# Patient Record
Sex: Male | Born: 1990 | Race: Black or African American | Hispanic: No | Marital: Single | Smoking: Current every day smoker
Health system: Southern US, Community
[De-identification: ages and names within clinical notes are randomized; demographics above are authoritative.]

## PROBLEM LIST (undated history)

## (undated) ENCOUNTER — Emergency Department: Admission: EM | Payer: Medicaid Other | Source: Home / Self Care

## (undated) DIAGNOSIS — F32A Depression, unspecified: Secondary | ICD-10-CM

## (undated) DIAGNOSIS — F319 Bipolar disorder, unspecified: Secondary | ICD-10-CM

## (undated) DIAGNOSIS — F1910 Other psychoactive substance abuse, uncomplicated: Secondary | ICD-10-CM

## (undated) DIAGNOSIS — F259 Schizoaffective disorder, unspecified: Secondary | ICD-10-CM

## (undated) DIAGNOSIS — F329 Major depressive disorder, single episode, unspecified: Secondary | ICD-10-CM

## (undated) HISTORY — PX: DENTAL SURGERY: SHX609

---

## 2012-06-25 ENCOUNTER — Encounter (HOSPITAL_COMMUNITY): Payer: Self-pay

## 2012-06-25 ENCOUNTER — Emergency Department (HOSPITAL_COMMUNITY)
Admission: EM | Admit: 2012-06-25 | Discharge: 2012-06-25 | Disposition: A | Discharge status: Home / Self Care | Payer: Self-pay | Attending: Emergency Medicine | Admitting: Emergency Medicine

## 2012-06-25 DIAGNOSIS — F319 Bipolar disorder, unspecified: Secondary | ICD-10-CM | POA: Insufficient documentation

## 2012-06-25 DIAGNOSIS — J069 Acute upper respiratory infection, unspecified: Secondary | ICD-10-CM | POA: Insufficient documentation

## 2012-06-25 DIAGNOSIS — Z79899 Other long term (current) drug therapy: Secondary | ICD-10-CM | POA: Insufficient documentation

## 2012-06-25 DIAGNOSIS — Z87891 Personal history of nicotine dependence: Secondary | ICD-10-CM | POA: Insufficient documentation

## 2012-06-25 DIAGNOSIS — F3289 Other specified depressive episodes: Secondary | ICD-10-CM | POA: Insufficient documentation

## 2012-06-25 DIAGNOSIS — F209 Schizophrenia, unspecified: Secondary | ICD-10-CM | POA: Insufficient documentation

## 2012-06-25 DIAGNOSIS — F329 Major depressive disorder, single episode, unspecified: Secondary | ICD-10-CM | POA: Insufficient documentation

## 2012-06-25 HISTORY — DX: Schizoaffective disorder, unspecified: F25.9

## 2012-06-25 HISTORY — DX: Bipolar disorder, unspecified: F31.9

## 2012-06-25 HISTORY — DX: Other psychoactive substance abuse, uncomplicated: F19.10

## 2012-06-25 HISTORY — DX: Depression, unspecified: F32.A

## 2012-06-25 HISTORY — DX: Major depressive disorder, single episode, unspecified: F32.9

## 2012-06-25 MED ORDER — ALBUTEROL SULFATE HFA 108 (90 BASE) MCG/ACT IN AERS
2.0000 | INHALATION_SPRAY | Freq: Once | RESPIRATORY_TRACT | Status: AC
  Administered 2012-06-25: 2 via RESPIRATORY_TRACT
  Filled 2012-06-25: qty 6.7

## 2012-06-25 MED ORDER — GUAIFENESIN ER 600 MG PO TB12: 1200.0000 mg | ORAL_TABLET | Freq: Two times a day (BID) | ORAL | Status: DC

## 2012-06-25 MED ORDER — DEXTROMETHORPHAN HBR 15 MG/5ML PO SYRP: 10.0000 mL | ORAL_SOLUTION | Freq: Four times a day (QID) | ORAL | Status: DC | PRN

## 2012-06-25 NOTE — ED Provider Notes (Signed)
Medical screening examination/treatment/procedure(s) were performed by non-physician practitioner and as supervising physician I was immediately available for consultation/collaboration.   Glynn Octave, MD 06/25/12 [phone]

## 2012-06-25 NOTE — ED Notes (Signed)
Pt presents with sudden onset of productive cough that has green phlegm and generalized body aches. Unsure of fever;  Other family members with same.

## 2012-06-25 NOTE — ED Provider Notes (Signed)
History     CSN: [id number]  Arrival date & time 06/25/12  1124   First MD Initiated Contact with Patient 06/25/12 1208      Chief Complaint  Patient presents with  . Cough    (Consider location/radiation/quality/duration/timing/severity/associated sxs/prior treatment) HPI 22 year old male with past medical history of substance abuse who is currently undergoing treatment at the [facility] health presents today with chief complaint of influenza-like illness.  Onset was yesterday.  He denies any fevers.  He has a productive cough with mild sore throat.  Pain in throat is worse with cough.  He also has pain in chest with cough.  He denies any otalgia.  Had rhinorrhea, malaise, myalgia.  Patient has a history of asthma and has had some mild wheezing at night. No other complaints at this time. Past Medical History  Diagnosis Date  . Bipolar 1 disorder   . Schizoaffective disorder   . Depression   . Drug abuse     Past Surgical History  Procedure Date  . Dental surgery     History reviewed. No pertinent family history.  History  Substance Use Topics  . Smoking status: Former Games developer  . Smokeless tobacco: Not on file  . Alcohol Use: No      Review of Systems Ten systems reviewed and are negative for acute change, except as noted in the HPI.   Allergies  Review of patient's allergies indicates no known allergies.  Home Medications   Current Outpatient Rx  Name  Route  Sig  Dispense  Refill  . DIVALPROEX SODIUM 250 MG PO TBEC   Oral   Take 250 mg by mouth 3 (three) times daily.         Marland Kitchen MIRTAZAPINE 15 MG PO TABS   Oral   Take 15 mg by mouth at bedtime.         Marland Kitchen NAPROXEN 500 MG PO TABS   Oral   Take 500 mg by mouth 2 (two) times daily with a meal.         . OLANZAPINE 10 MG PO TABS   Oral   Take 10 mg by mouth at bedtime.         Marland Kitchen DEXTROMETHORPHAN HBR 15 MG/5ML PO SYRP   Oral   Take 10 mLs (30 mg total) by mouth 4 (four) times daily as needed for  cough.   120 mL   0   . GUAIFENESIN ER 600 MG PO TB12   Oral   Take 2 tablets (1,200 mg total) by mouth 2 (two) times daily.   20 tablet   0     BP 134/70  Pulse 92  Temp 98.5 F (36.9 C) (Oral)  Resp 18  SpO2 98%  Physical Exam Appears moderately ill but not toxic; temperature as noted in vitals. Ears normal.  TMs normal Eyes:glassy appearance, no discharge  Heart: RRR, NO M/G/R Throat and pharynx mild erythema.  Cobblestoning on the posterior pharynx consistent with postnasal drip.   Neck supple. No adenopathyhy in the neck.  Sinuses non tender.  The chest is clear. Abdomen is soft and nontende  ED Course  Procedures (including critical care time)  Labs Reviewed - No data to display No results found.   1. URI (upper respiratory infection)   2. Influenza-like illness       MDM  Patient with symptoms consistent with influenza.  Vitals are stable, low-grade fever.  No signs of dehydration, tolerating PO's.  Lungs are clear. Due  to patient's presentation and physical exam a chest x-ray was not ordered bc likely diagnosis of flu.  Discussed the cost versus benefit of Tamiflu treatment with the patient.  The patient understands that symptoms are greater than the recommended 24-48 hour window of treatment.  Patient will be discharged with instructions to orally hydrate, rest, and use over-the-counter medications such as anti-inflammatories ibuprofen and Aleve for muscle aches and Tylenol for fever.  Patient will also be given a cough suppressant.         Arthor Captain, PA-C 06/25/12 1418

## 2012-07-10 ENCOUNTER — Emergency Department (HOSPITAL_COMMUNITY)
Admission: EM | Admit: 2012-07-10 | Discharge: 2012-07-10 | Disposition: A | Discharge status: Home / Self Care | Payer: Self-pay | Attending: Emergency Medicine | Admitting: Emergency Medicine

## 2012-07-10 ENCOUNTER — Encounter (HOSPITAL_COMMUNITY): Payer: Self-pay | Admitting: Emergency Medicine

## 2012-07-10 ENCOUNTER — Emergency Department (HOSPITAL_COMMUNITY): Payer: Self-pay

## 2012-07-10 DIAGNOSIS — S60229A Contusion of unspecified hand, initial encounter: Secondary | ICD-10-CM | POA: Insufficient documentation

## 2012-07-10 DIAGNOSIS — Z79899 Other long term (current) drug therapy: Secondary | ICD-10-CM | POA: Insufficient documentation

## 2012-07-10 DIAGNOSIS — Y929 Unspecified place or not applicable: Secondary | ICD-10-CM | POA: Insufficient documentation

## 2012-07-10 DIAGNOSIS — Z87891 Personal history of nicotine dependence: Secondary | ICD-10-CM | POA: Insufficient documentation

## 2012-07-10 DIAGNOSIS — F209 Schizophrenia, unspecified: Secondary | ICD-10-CM | POA: Insufficient documentation

## 2012-07-10 DIAGNOSIS — F259 Schizoaffective disorder, unspecified: Secondary | ICD-10-CM | POA: Insufficient documentation

## 2012-07-10 DIAGNOSIS — F319 Bipolar disorder, unspecified: Secondary | ICD-10-CM | POA: Insufficient documentation

## 2012-07-10 DIAGNOSIS — F329 Major depressive disorder, single episode, unspecified: Secondary | ICD-10-CM | POA: Insufficient documentation

## 2012-07-10 DIAGNOSIS — Z791 Long term (current) use of non-steroidal anti-inflammatories (NSAID): Secondary | ICD-10-CM | POA: Insufficient documentation

## 2012-07-10 DIAGNOSIS — F3289 Other specified depressive episodes: Secondary | ICD-10-CM | POA: Insufficient documentation

## 2012-07-10 DIAGNOSIS — W2209XA Striking against other stationary object, initial encounter: Secondary | ICD-10-CM | POA: Insufficient documentation

## 2012-07-10 DIAGNOSIS — Y9389 Activity, other specified: Secondary | ICD-10-CM | POA: Insufficient documentation

## 2012-07-10 MED ORDER — IBUPROFEN 800 MG PO TABS: 800.0000 mg | ORAL_TABLET | Freq: Three times a day (TID) | ORAL | Status: DC

## 2012-07-10 MED ORDER — TRAMADOL HCL 50 MG PO TABS: 50.0000 mg | ORAL_TABLET | Freq: Four times a day (QID) | ORAL | Status: DC | PRN

## 2012-07-10 MED ORDER — IBUPROFEN 400 MG PO TABS
800.0000 mg | ORAL_TABLET | Freq: Once | ORAL | Status: AC
  Administered 2012-07-10: 800 mg via ORAL
  Filled 2012-07-10: qty 2

## 2012-07-10 MED ORDER — TRAMADOL HCL 50 MG PO TABS
50.0000 mg | ORAL_TABLET | Freq: Once | ORAL | Status: AC
  Administered 2012-07-10: 50 mg via ORAL
  Filled 2012-07-10: qty 1

## 2012-07-10 NOTE — ED Notes (Signed)
Pt. Stated, i was at the car wash and I slammed my rt. Hand in the car door.

## 2012-07-10 NOTE — ED Provider Notes (Signed)
History    This chart was scribed for Ryan Emery PA-C a non-physician practitioner working with Suzi Roots, MD by Lewanda Rife, ED Scribe. This patient was seen in room TR06C/TR06C and the patient's care was started at 3:50pm.    CSN: [id number]  Arrival date & time 07/10/12  1424   First MD Initiated Contact with Patient 07/10/12 1438      Chief Complaint  Patient presents with  . Hand Injury    (Consider location/radiation/quality/duration/timing/severity/associated sxs/prior treatment) HPI Ryan Mcknight is a 22 y.o. male who presents to the Emergency Department complaining of constant moderate right hand pain after slamming it in a car door at the car wash onset today. Pt reports pain is improving at this time. Pt denies any other injuries. Pt reports pain increases with movement and improved at rest. Pt denies taking any pain medication or applying ice to treat symptoms.  Past Medical History  Diagnosis Date  . Bipolar 1 disorder   . Schizoaffective disorder   . Depression   . Drug abuse     Past Surgical History  Procedure Date  . Dental surgery     No family history on file.  History  Substance Use Topics  . Smoking status: Former Games developer  . Smokeless tobacco: Not on file  . Alcohol Use: No      Review of Systems  Constitutional: Negative.   HENT: Negative.   Respiratory: Negative.   Cardiovascular: Negative.   Gastrointestinal: Negative.   Musculoskeletal: Positive for myalgias (hand injury).  Skin: Negative.   Neurological: Negative.   Hematological: Negative.   Psychiatric/Behavioral: Negative.   All other systems reviewed and are negative.   A complete 10 system review of systems was obtained and all systems are negative except as noted in the HPI and PMH.   Allergies  Review of patient's allergies indicates no known allergies.  Home Medications   Current Outpatient Rx  Name  Route  Sig  Dispense  Refill  .  DEXTROMETHORPHAN HBR 15 MG/5ML PO SYRP   Oral   Take 10 mLs (30 mg total) by mouth 4 (four) times daily as needed for cough.   120 mL   0   . DIVALPROEX SODIUM 250 MG PO TBEC   Oral   Take 250 mg by mouth 3 (three) times daily.         . GUAIFENESIN ER 600 MG PO TB12   Oral   Take 2 tablets (1,200 mg total) by mouth 2 (two) times daily.   20 tablet   0   . MIRTAZAPINE 15 MG PO TABS   Oral   Take 15 mg by mouth at bedtime.         Marland Kitchen NAPROXEN 500 MG PO TABS   Oral   Take 500 mg by mouth 2 (two) times daily with a meal.         . OLANZAPINE 10 MG PO TABS   Oral   Take 10 mg by mouth at bedtime.           BP 134/77  Pulse 98  Temp 97.9 F (36.6 C) (Oral)  Resp 16  SpO2 98%  Physical Exam  Nursing note and vitals reviewed. Constitutional: He is oriented to person, place, and time. He appears well-developed and well-nourished. No distress.  HENT:  Head: Normocephalic.  Eyes: Conjunctivae normal and EOM are normal.  Neck: Normal range of motion. Neck supple.  Cardiovascular: Normal rate.   Pulmonary/Chest: Effort normal.  No stridor.  Musculoskeletal: Normal range of motion. He exhibits tenderness.       Mild swelling to dorsum of right hand.  Nonfocal tenderness to palpation to all 4 MCPs of right hand. Full active ROM of all 5 digits of right hand with flexion and extension.  Neurological: He is alert and oriented to person, place, and time.  Skin: Skin is warm.  Psychiatric: He has a normal mood and affect. His behavior is normal.    ED Course  Procedures (including critical care time)  Labs Reviewed - No data to display Dg Hand Complete Right  07/10/2012  *RADIOLOGY REPORT*  Clinical Data: Slammed right hand in a car door now with pain radiating across all MCP joints.  RIGHT HAND - COMPLETE 3+ VIEW  Comparison: None.  Findings:  There is minimal soft tissue swelling about the MCP joints of the hand without associated fracture or dislocation.  No  radiopaque foreign body.  Joint spaces are preserved.  No erosions.  IMPRESSION: Minimal soft tissue swelling about the MCP joints of the hand without associated fracture, dislocation or radiopaque foreign body.   Original Report Authenticated By: Tacey Ruiz, MD      1. Hand contusion       MDM     Filed Vitals:   07/10/12 1429  BP: 134/77  Pulse: 98  Temp: 97.9 F (36.6 C)  TempSrc: Oral  Resp: 16  SpO2: 98%     Pt verbalized understanding and agrees with care plan. Outpatient follow-up and return precautions given.    New Prescriptions   IBUPROFEN (ADVIL,MOTRIN) 800 MG TABLET    Take 1 tablet (800 mg total) by mouth 3 (three) times daily.   TRAMADOL (ULTRAM) 50 MG TABLET    Take 1 tablet (50 mg total) by mouth every 6 (six) hours as needed for pain.    I personally performed the services described in this documentation, which was scribed in my presence. The recorded information has been reviewed and is accurate.   Ryan Emery, PA-C 07/12/12 1224

## 2012-07-13 NOTE — ED Provider Notes (Signed)
Medical screening examination/treatment/procedure(s) were performed by non-physician practitioner and as supervising physician I was immediately available for consultation/collaboration.   Suzi Roots, MD 07/13/12 [phone]

## 2018-12-11 ENCOUNTER — Emergency Department
Admission: EM | Admit: 2018-12-11 | Discharge: 2018-12-11 | Disposition: A | Discharge status: Home / Self Care | Payer: Self-pay | Attending: Emergency Medicine | Admitting: Emergency Medicine

## 2018-12-11 ENCOUNTER — Emergency Department: Payer: Self-pay

## 2018-12-11 ENCOUNTER — Encounter: Payer: Self-pay | Admitting: Emergency Medicine

## 2018-12-11 ENCOUNTER — Other Ambulatory Visit: Payer: Self-pay

## 2018-12-11 DIAGNOSIS — R0789 Other chest pain: Secondary | ICD-10-CM | POA: Insufficient documentation

## 2018-12-11 DIAGNOSIS — Z79899 Other long term (current) drug therapy: Secondary | ICD-10-CM | POA: Insufficient documentation

## 2018-12-11 DIAGNOSIS — K219 Gastro-esophageal reflux disease without esophagitis: Secondary | ICD-10-CM | POA: Insufficient documentation

## 2018-12-11 DIAGNOSIS — F17210 Nicotine dependence, cigarettes, uncomplicated: Secondary | ICD-10-CM | POA: Insufficient documentation

## 2018-12-11 LAB — COMPREHENSIVE METABOLIC PANEL
ALT: 18 U/L (ref 0–44)
AST: 30 U/L (ref 15–41)
Albumin: 4 g/dL (ref 3.5–5.0)
Alkaline Phosphatase: 75 U/L (ref 38–126)
Anion gap: 11 (ref 5–15)
BUN: 13 mg/dL (ref 6–20)
CO2: 20 mmol/L — ABNORMAL LOW (ref 22–32)
Calcium: 9.4 mg/dL (ref 8.9–10.3)
Chloride: 106 mmol/L (ref 98–111)
Creatinine, Ser: 0.82 mg/dL (ref 0.61–1.24)
GFR calc Af Amer: >60 mL/min (ref >60)
GFR calc non Af Amer: >60 mL/min (ref >60)
Glucose, Bld: 91 mg/dL (ref 70–99)
Potassium: 4.6 mmol/L (ref 3.5–5.1)
Sodium: 137 mmol/L (ref 135–145)
Total Bilirubin: 0.9 mg/dL (ref 0.3–1.2)
Total Protein: 7.5 g/dL (ref 6.5–8.1)

## 2018-12-11 LAB — URINE DRUG SCREEN, QUALITATIVE (ARMC ONLY)
Amphetamines, Ur Screen: NOT DETECTED
Barbiturates, Ur Screen: NOT DETECTED
Benzodiazepine, Ur Scrn: NOT DETECTED
Cannabinoid 50 Ng, Ur ~~LOC~~: NOT DETECTED
Cocaine Metabolite,Ur ~~LOC~~: NOT DETECTED
MDMA (Ecstasy)Ur Screen: NOT DETECTED
Methadone Scn, Ur: NOT DETECTED
Opiate, Ur Screen: NOT DETECTED
Phencyclidine (PCP) Ur S: NOT DETECTED
Tricyclic, Ur Screen: NOT DETECTED

## 2018-12-11 LAB — CBC WITH DIFFERENTIAL/PLATELET
Abs Immature Granulocytes: 0.02 10*3/uL (ref 0.00–0.07)
Basophils Absolute: 0.1 10*3/uL (ref 0.0–0.1)
Basophils Relative: 1 %
Eosinophils Absolute: 0.1 10*3/uL (ref 0.0–0.5)
Eosinophils Relative: 1 %
HCT: 48.8 % (ref 39.0–52.0)
Hemoglobin: 16.4 g/dL (ref 13.0–17.0)
Immature Granulocytes: 0 %
Lymphocytes Relative: 35 %
Lymphs Abs: 3.8 10*3/uL (ref 0.7–4.0)
MCH: 30.4 pg (ref 26.0–34.0)
MCHC: 33.6 g/dL (ref 30.0–36.0)
MCV: 90.5 fL (ref 80.0–100.0)
Monocytes Absolute: 0.6 10*3/uL (ref 0.1–1.0)
Monocytes Relative: 6 %
Neutro Abs: 6.1 10*3/uL (ref 1.7–7.7)
Neutrophils Relative %: 57 %
Platelets: 232 10*3/uL (ref 150–400)
RBC: 5.39 MIL/uL (ref 4.22–5.81)
RDW: 12.5 % (ref 11.5–15.5)
WBC: 10.7 10*3/uL — ABNORMAL HIGH (ref 4.0–10.5)
nRBC: 0 % (ref 0.0–0.2)

## 2018-12-11 LAB — LIPASE, BLOOD: Lipase: 24 U/L (ref 11–51)

## 2018-12-11 LAB — TROPONIN I (HIGH SENSITIVITY): Troponin I (High Sensitivity): 6 ng/L (ref <18)

## 2018-12-11 IMAGING — CR DG ABDOMEN ACUTE W/ 1V CHEST
7 of 10 series · 7 of 10 positions shown · non-contrast
Comparison: None.

CLINICAL DATA: Chest pain. Left upper quadrant pain.

EXAM:
DG ABDOMEN ACUTE W/ 1V CHEST

[chest pa (1 of 4)]
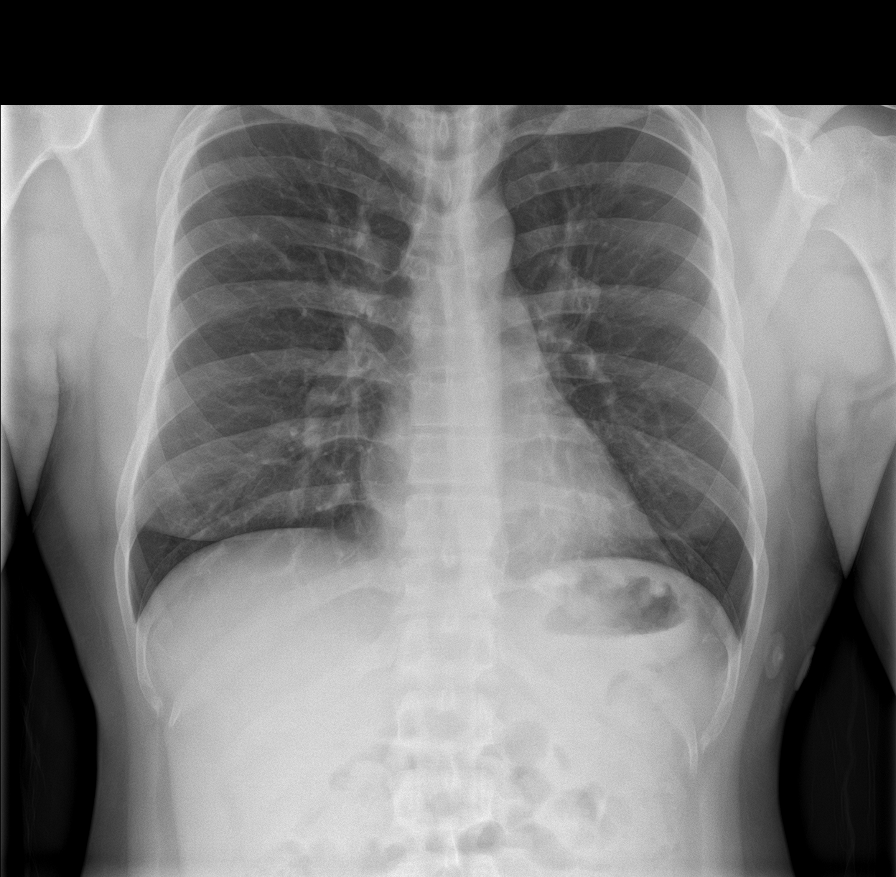

[chest pa (2 of 4)]
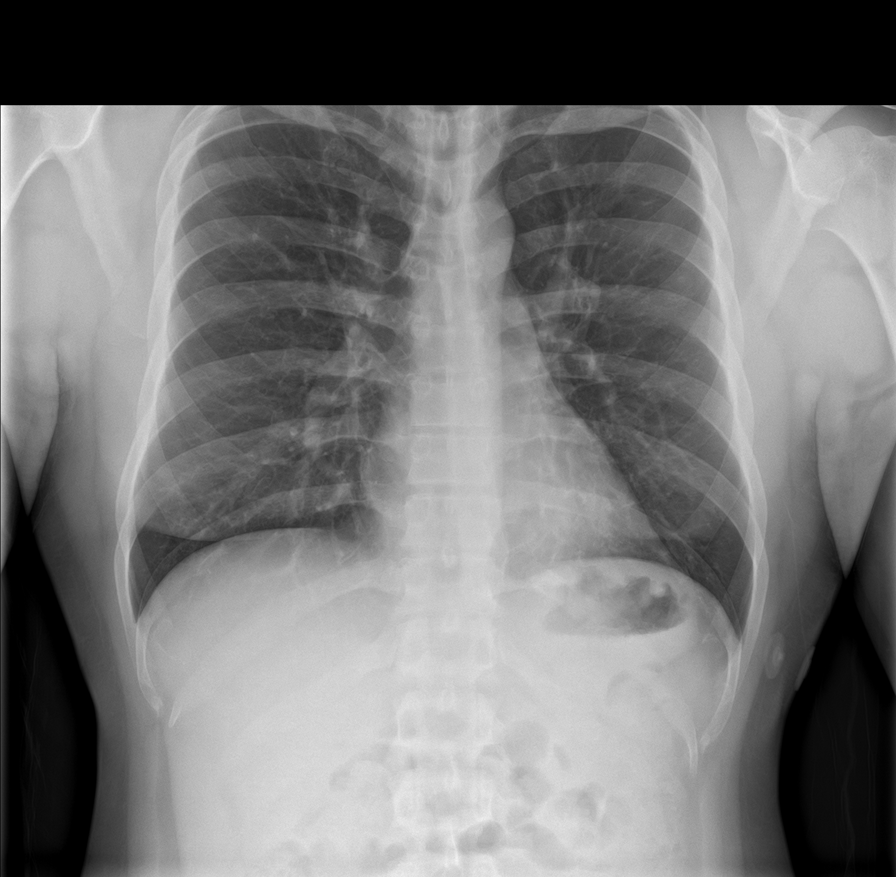

[chest pa (3 of 4)]
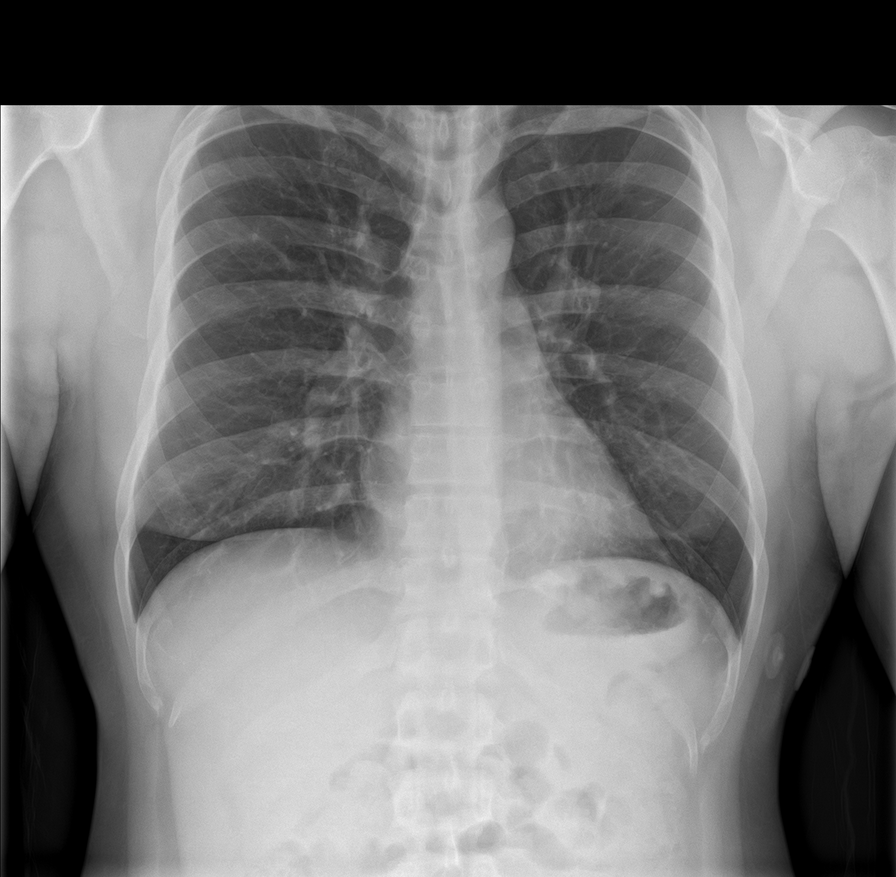

[chest pa (4 of 4)]
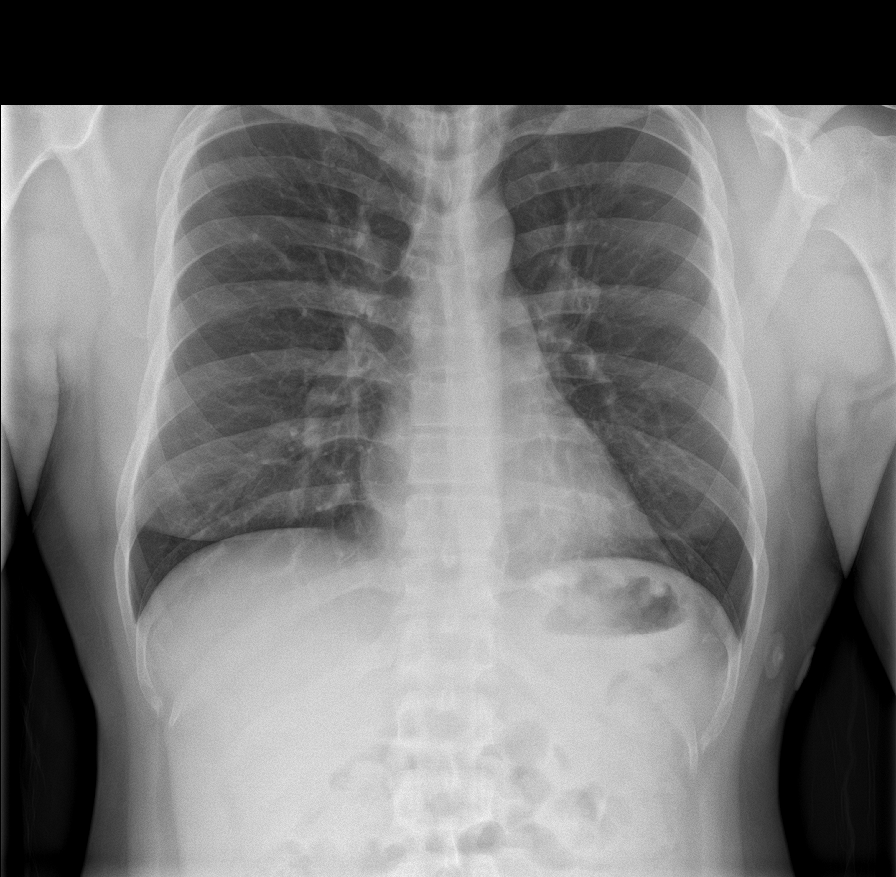

[abdomen erect (1 of 3)]
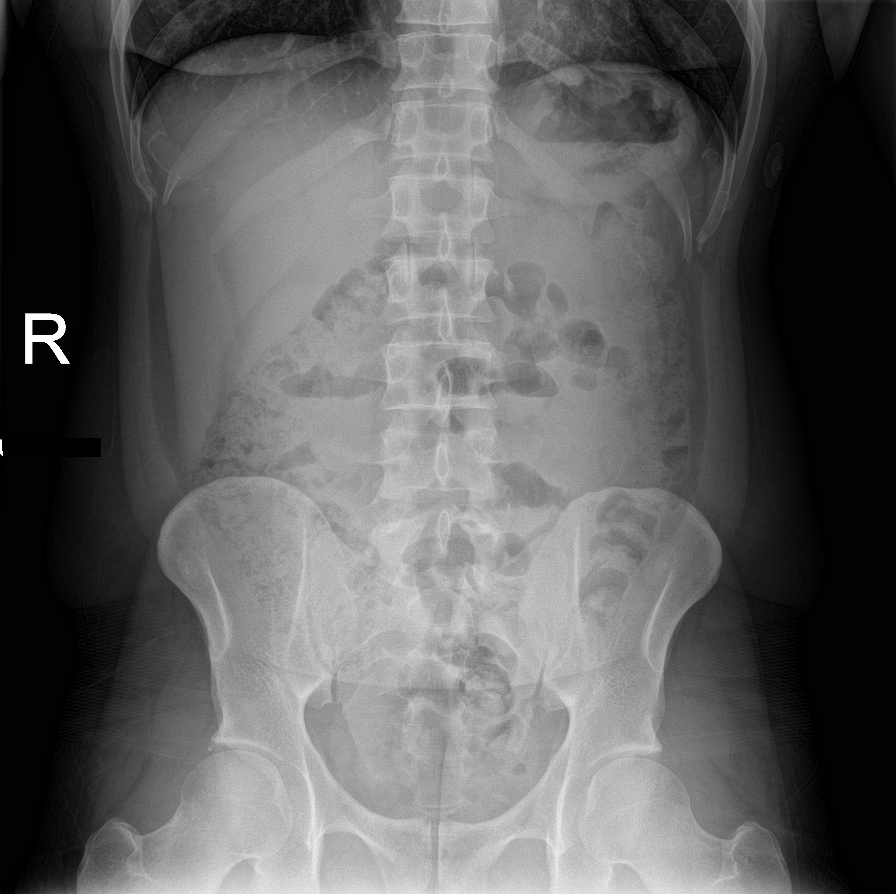

[abdomen erect (2 of 3)]
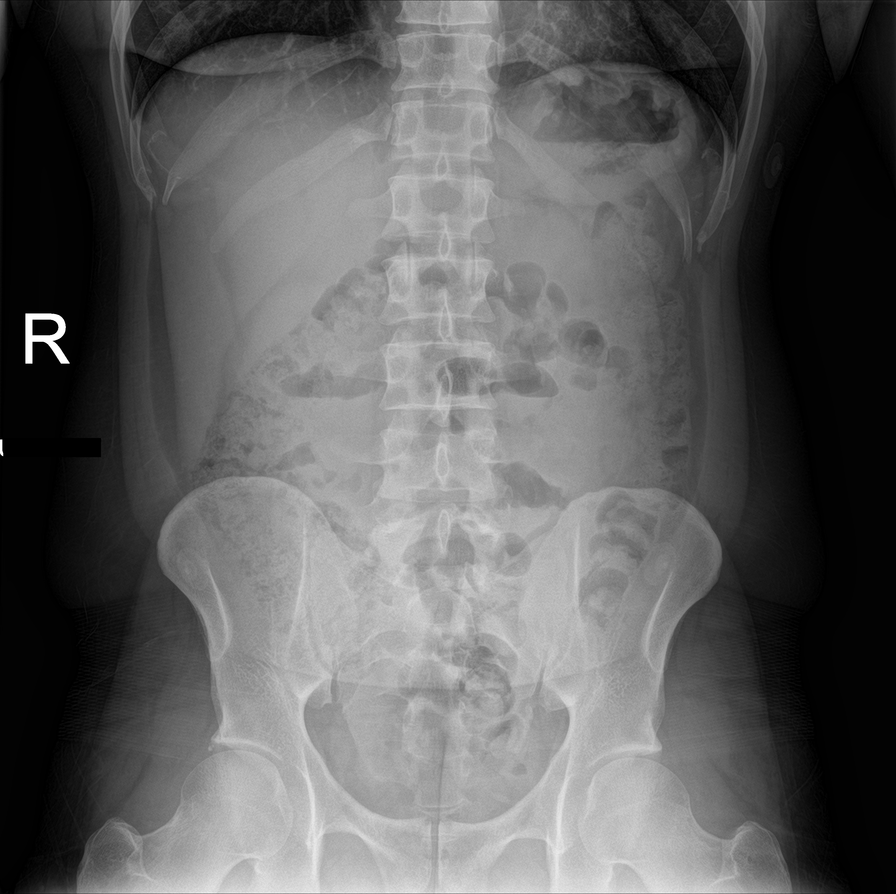

[abdomen erect (3 of 3)]
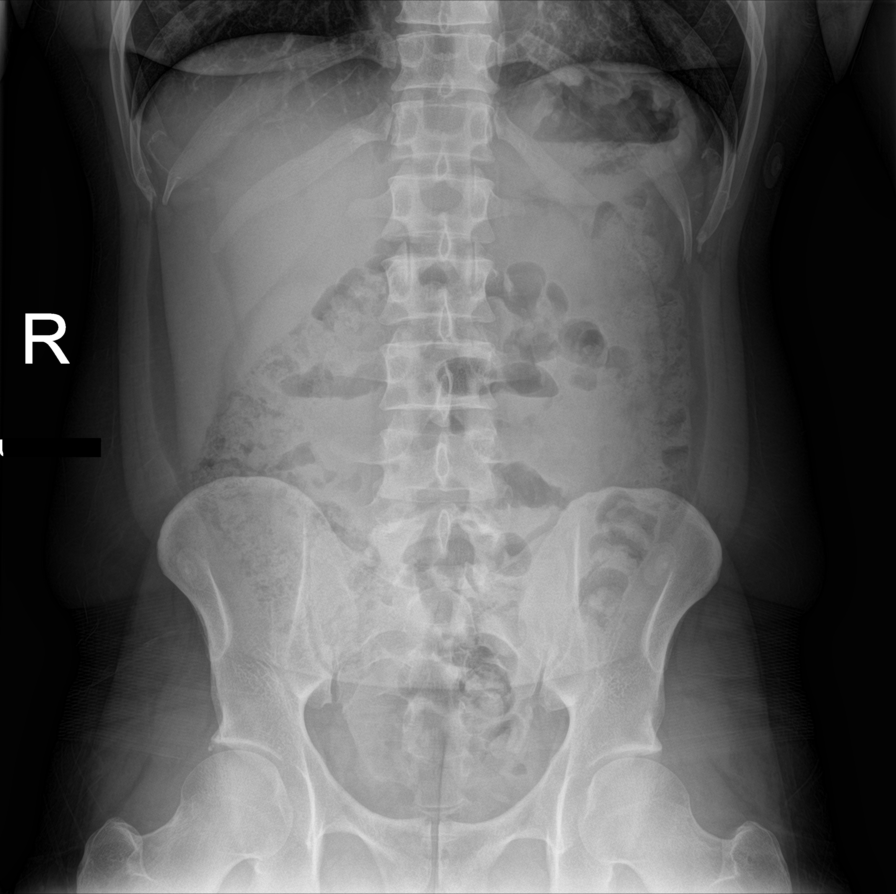

[7 of 10 positions shown; findings below may reference images not displayed]

FINDINGS: The cardiomediastinal contours are normal. The lungs are clear. EKG
leads overlie the chest. There is no free intra-abdominal air. No
dilated bowel loops to suggest obstruction. Moderate volume of stool
in the ascending and descending colon. No radiopaque calculi. No
acute osseous abnormalities are seen.
IMPRESSION: 1. Normal bowel gas pattern. Moderate volume of colonic stool.
2. Clear lungs.

## 2018-12-11 MED ORDER — LIDOCAINE VISCOUS HCL 2 % MT SOLN
15.0000 mL | Freq: Once | OROMUCOSAL | Status: AC
  Administered 2018-12-11: 15 mL via ORAL
  Filled 2018-12-11: qty 15

## 2018-12-11 MED ORDER — FAMOTIDINE IN NACL 20-0.9 MG/50ML-% IV SOLN
20.0000 mg | Freq: Once | INTRAVENOUS | Status: AC
  Administered 2018-12-11: 20 mg via INTRAVENOUS
  Filled 2018-12-11: qty 50

## 2018-12-11 MED ORDER — ALUM & MAG HYDROXIDE-SIMETH 200-200-20 MG/5ML PO SUSP
30.0000 mL | Freq: Once | ORAL | Status: AC
  Administered 2018-12-11: 30 mL via ORAL
  Filled 2018-12-11: qty 30

## 2018-12-11 MED ORDER — FAMOTIDINE 40 MG PO TABS: 40.0000 mg | ORAL_TABLET | Freq: Every day | ORAL | 0 refills | Status: AC

## 2018-12-11 NOTE — ED Triage Notes (Signed)
Patient with complaint of chest pain that started 2-3 days ago. Patient states that the pain woke him from his sleep tonight. Denies shortness of breath, nausea or vomiting.

## 2018-12-11 NOTE — ED Notes (Signed)
Pt to xray at this time.

## 2018-12-11 NOTE — ED Provider Notes (Signed)
[facility] Emergency Department Provider Note  ____________________________________________  Time seen: Approximately 2:30 AM  I have reviewed the triage vital signs and the nursing notes.   HISTORY  Chief Complaint Chest Pain   HPI Ryan Mcknight is a 28 y.o. male history of schizoaffective disorder, smoking, former drug abuse now clean for 2 years who presents for evaluation of chest pain.  Patient reports 3 to 4 days of constant pressure located on the left side of his chest which is mild to moderate in intensity.  He reports that if he stretches or if he pulls his rib cage away from his body that the pain relieves.  He denies shortness of breath or cough, fever chills, abdominal pain, nausea, vomiting, diarrhea.  No personal family history of heart attacks, PE or DVT, no recent travel immobilization, no leg pain or swelling, no hemoptysis, no exogenous hormones.  Patient reports intermittent episodes of burning in his chest as well.  He has been taking over-the-counter NSAIDs and Tums with minimal relief.  This evening he reports a sharp pain on top of the constant pressure which woke him up from his sleep.  That sharp pain lasted just a few seconds and has resolved.  Past Medical History:  Diagnosis Date  . Bipolar 1 disorder ([place])   . Depression   . Drug abuse ([place])   . Schizoaffective disorder ([place])     There are no active problems to display for this patient.   Past Surgical History:  Procedure Laterality Date  . DENTAL SURGERY      Prior to Admission medications   Medication Sig Start Date End Date Taking? Authorizing Provider  dextromethorphan (COUGH SUPPRESSANT) 15 MG/5ML syrup Take 10 mLs (30 mg total) by mouth 4 (four) times daily as needed for cough. 06/25/12   Margarita Mail, PA-C  divalproex (DEPAKOTE) 250 MG DR tablet Take 250 mg by mouth 3 (three) times daily.    [provider]  famotidine (PEPCID) 40 MG tablet Take 1  tablet (40 mg total) by mouth at bedtime. 12/11/18 01/10/19  Rudene Re, MD  guaiFENesin (MUCINEX) 600 MG 12 hr tablet Take 2 tablets (1,200 mg total) by mouth 2 (two) times daily. 06/25/12   Margarita Mail, PA-C  ibuprofen (ADVIL,MOTRIN) 800 MG tablet Take 1 tablet (800 mg total) by mouth 3 (three) times daily. 07/10/12   Pisciotta, Elmyra Ricks, PA-C  mirtazapine (REMERON) 15 MG tablet Take 15 mg by mouth at bedtime.    [provider]  naproxen (NAPROSYN) 500 MG tablet Take 500 mg by mouth 2 (two) times daily with a meal.    [provider]  OLANZapine (ZYPREXA) 10 MG tablet Take 10 mg by mouth at bedtime.    [provider]  traMADol (ULTRAM) 50 MG tablet Take 1 tablet (50 mg total) by mouth every 6 (six) hours as needed for pain. 07/10/12   Pisciotta, Elmyra Ricks, PA-C    Allergies Patient has no known allergies.  No family history on file.  Social History Social History   Tobacco Use  . Smoking status: Current Every Day Smoker  . Smokeless tobacco: Never Used  Substance Use Topics  . Alcohol use: No  . Drug use: Not Currently    Review of Systems  Constitutional: Negative for fever. Eyes: Negative for visual changes. ENT: Negative for sore throat. Neck: No neck pain  Cardiovascular: + chest pain. Respiratory: Negative for shortness of breath. Gastrointestinal: Negative for abdominal pain, vomiting or diarrhea. Genitourinary: Negative  for dysuria. Musculoskeletal: Negative for back pain. Skin: Negative for rash. Neurological: Negative for headaches, weakness or numbness. Psych: No SI or HI  ____________________________________________   PHYSICAL EXAM:  VITAL SIGNS: ED Triage Vitals  Enc Vitals Group     BP 12/11/18 0215 126/90     Pulse Rate 12/11/18 0215 (!) 58     Resp 12/11/18 0215 18     Temp 12/11/18 0215 98 F (36.7 C)     Temp Source 12/11/18 0215 Oral     SpO2 12/11/18 0215 95 %     Weight 12/11/18 0212 195 lb (88.5 kg)     Height  12/11/18 0212 5\' 9"  (1.753 m)     Head Circumference --      Peak Flow --      Pain Score 12/11/18 0212 9     Pain Loc --      Pain Edu? --      Excl. in GC? --     Constitutional: Alert and oriented. Well appearing and in no apparent distress. HEENT:      Head: Normocephalic and atraumatic.         Eyes: Conjunctivae are normal. Sclera is non-icteric.       Mouth/Throat: Mucous membranes are moist.       Neck: Supple with no signs of meningismus. Cardiovascular: Regular rate and rhythm. No murmurs, gallops, or rubs. 2+ symmetrical distal pulses are present in all extremities. No JVD.  Palpation of the chest wall on the left reproduces the pain Respiratory: Normal respiratory effort. Lungs are clear to auscultation bilaterally. No wheezes, crackles, or rhonchi.  Gastrointestinal: Soft, tender in LUQ, and non distended with positive bowel sounds. No rebound or guarding. Musculoskeletal: Nontender with normal range of motion in all extremities. No edema, cyanosis, or erythema of extremities. Neurologic: Normal speech and language. Face is symmetric. Moving all extremities. No gross focal neurologic deficits are appreciated. Skin: Skin is warm, dry and intact. No rash noted. Psychiatric: Mood and affect are normal. Speech and behavior are normal.  ____________________________________________   LABS (all labs ordered are listed, but only abnormal results are displayed)  Labs Reviewed  CBC WITH DIFFERENTIAL/PLATELET - Abnormal; Notable for the following components:      Result Value   WBC 10.7 (*)    All other components within normal limits  COMPREHENSIVE METABOLIC PANEL - Abnormal; Notable for the following components:   CO2 20 (*)    All other components within normal limits  TROPONIN I (HIGH SENSITIVITY)  URINE DRUG SCREEN, QUALITATIVE ([facility] ONLY)  LIPASE, BLOOD   ____________________________________________  EKG  ED ECG REPORT I, Nita Sicklearolina Anuj Summons, the attending  physician, personally viewed and interpreted this ECG.  Sinus bradycardia, rate of 59, normal intervals, right axis deviation, no ST elevations or depressions.  No prior for comparison. ____________________________________________  RADIOLOGY  I have personally reviewed the images performed during this visit and I agree with the Radiologist's read.   Interpretation by Radiologist:  Dg Abdomen Acute W/chest  Result Date: 12/11/2018 CLINICAL DATA:  Chest pain. Left upper quadrant pain. EXAM: DG ABDOMEN ACUTE W/ 1V CHEST COMPARISON:  None. FINDINGS: The cardiomediastinal contours are normal. The lungs are clear. EKG leads overlie the chest. There is no free intra-abdominal air. No dilated bowel loops to suggest obstruction. Moderate volume of stool in the ascending and descending colon. No radiopaque calculi. No acute osseous abnormalities are seen. IMPRESSION: 1. Normal bowel gas pattern. Moderate volume of colonic stool. 2. Clear lungs.  Electronically Signed   By: Narda RutherfordMelanie  Sanford M.D.   On: 12/11/2018 03:30      ____________________________________________   PROCEDURES  Procedure(s) performed: None Procedures Critical Care performed:  None ____________________________________________   INITIAL IMPRESSION / ASSESSMENT AND PLAN / ED COURSE  28 y.o. male history of schizoaffective disorder, smoking, former drug abuse now clean for 2 years who presents for evaluation of chest pain.  Patient with atypical chest pain that he describes as pressure located in the left side of his chest for 4 days that is better with stretching and worse with palpation of the chest wall.  Has not lifted anything heavy or done any workout recently.  No associated symptoms.  Heart score of 0.  PERC negative.  No clinical signs of dissection with normal mediastinum silhouette, normal blood pressure, pain does not radiate to the back, no neuro deficits, pulses intact x4.  Patient has mild left upper quadrant  tenderness, has been taking NSAIDs at home, possible gastritis versus peptic ulcer disease versus MSK versus GERD versus costochondritis.  No fever cough therefore low suspicion for pneumonia or COVID.  No surgical abdomen therefore low suspicion for perforated ulcer.  Will give a GI cocktail and IV Pepcid.  Will check basic labs.  EKG shows no ischemic changes.  Clinical Course as of Dec 11 338  Sat Dec 11, 2018  [id number] Patient reports resolution of his pain with IV Pepcid and a GI cocktail.  Recommended stopping NSAIDs and taking Pepcid daily for the next several weeks for possible gastritis/GERD.  Drug screen negative.  Labs including troponin showing no acute findings.  With constant pain for 4 days patient does not warrant a repeat troponin at this time.  Recommended follow-up with primary care doctor and discussed my standard return precautions.   [CV]    Clinical Course User Index [CV] Don PerkingVeronese, [place], MD     As part of my medical decision making, I reviewed the following data within the electronic MEDICAL RECORD NUMBER Nursing notes reviewed and incorporated, Labs reviewed , EKG interpreted , Old chart reviewed, Radiograph reviewed , Notes from prior ED visits and [place] Controlled Substance Database    Pertinent labs & imaging results that were available during my care of the patient were reviewed by me and considered in my medical decision making (see chart for details).    ____________________________________________   FINAL CLINICAL IMPRESSION(S) / ED DIAGNOSES  Final diagnoses:  Atypical chest pain  Gastroesophageal reflux disease, esophagitis presence not specified      NEW MEDICATIONS STARTED DURING THIS VISIT:  ED Discharge Orders         Ordered    famotidine (PEPCID) 40 MG tablet  Daily at bedtime     12/11/18 [id number]           Note:  This document was prepared using Dragon voice recognition software and may include unintentional dictation errors.    Don PerkingVeronese,  [place], MD 12/11/18 [phone]

## 2018-12-11 NOTE — ED Notes (Signed)
Pt asked to provide urine sample at this time.

## 2018-12-11 NOTE — Discharge Instructions (Addendum)

## 2019-01-18 ENCOUNTER — Emergency Department: Admission: EM | Admit: 2019-01-18 | Discharge: 2019-01-19 | Discharge status: Absent without leave | Payer: Self-pay

## 2019-01-18 ENCOUNTER — Other Ambulatory Visit: Payer: Self-pay

## 2019-01-19 ENCOUNTER — Encounter: Payer: Self-pay | Admitting: Emergency Medicine

## 2019-01-19 ENCOUNTER — Emergency Department
Admission: EM | Admit: 2019-01-19 | Discharge: 2019-01-19 | Disposition: A | Discharge status: Home / Self Care | Payer: Self-pay | Attending: Internal Medicine | Admitting: Internal Medicine

## 2019-01-19 ENCOUNTER — Other Ambulatory Visit: Payer: Self-pay

## 2019-01-19 ENCOUNTER — Emergency Department: Payer: Self-pay

## 2019-01-19 DIAGNOSIS — F17210 Nicotine dependence, cigarettes, uncomplicated: Secondary | ICD-10-CM | POA: Insufficient documentation

## 2019-01-19 DIAGNOSIS — K047 Periapical abscess without sinus: Secondary | ICD-10-CM | POA: Insufficient documentation

## 2019-01-19 DIAGNOSIS — Z79899 Other long term (current) drug therapy: Secondary | ICD-10-CM | POA: Insufficient documentation

## 2019-01-19 LAB — CBC WITH DIFFERENTIAL/PLATELET
Abs Immature Granulocytes: 0.04 10*3/uL (ref 0.00–0.07)
Basophils Absolute: 0.1 10*3/uL (ref 0.0–0.1)
Basophils Relative: 1 %
Eosinophils Absolute: 0.2 10*3/uL (ref 0.0–0.5)
Eosinophils Relative: 1 %
HCT: 47.2 % (ref 39.0–52.0)
Hemoglobin: 15.5 g/dL (ref 13.0–17.0)
Immature Granulocytes: 0 %
Lymphocytes Relative: 24 %
Lymphs Abs: 2.9 10*3/uL (ref 0.7–4.0)
MCH: 30 pg (ref 26.0–34.0)
MCHC: 32.8 g/dL (ref 30.0–36.0)
MCV: 91.5 fL (ref 80.0–100.0)
Monocytes Absolute: 0.8 10*3/uL (ref 0.1–1.0)
Monocytes Relative: 7 %
Neutro Abs: 8.3 10*3/uL — ABNORMAL HIGH (ref 1.7–7.7)
Neutrophils Relative %: 67 %
Platelets: 240 10*3/uL (ref 150–400)
RBC: 5.16 MIL/uL (ref 4.22–5.81)
RDW: 12.7 % (ref 11.5–15.5)
WBC: 12.3 10*3/uL — ABNORMAL HIGH (ref 4.0–10.5)
nRBC: 0 % (ref 0.0–0.2)

## 2019-01-19 LAB — COMPREHENSIVE METABOLIC PANEL
ALT: 17 U/L (ref 0–44)
AST: 27 U/L (ref 15–41)
Albumin: 4.1 g/dL (ref 3.5–5.0)
Alkaline Phosphatase: 88 U/L (ref 38–126)
Anion gap: 5 (ref 5–15)
BUN: 14 mg/dL (ref 6–20)
CO2: 24 mmol/L (ref 22–32)
Calcium: 8.8 mg/dL — ABNORMAL LOW (ref 8.9–10.3)
Chloride: 108 mmol/L (ref 98–111)
Creatinine, Ser: 0.83 mg/dL (ref 0.61–1.24)
GFR calc Af Amer: >60 mL/min (ref >60)
GFR calc non Af Amer: >60 mL/min (ref >60)
Glucose, Bld: 100 mg/dL — ABNORMAL HIGH (ref 70–99)
Potassium: 4.2 mmol/L (ref 3.5–5.1)
Sodium: 137 mmol/L (ref 135–145)
Total Bilirubin: 0.8 mg/dL (ref 0.3–1.2)
Total Protein: 7.3 g/dL (ref 6.5–8.1)

## 2019-01-19 MED ORDER — LIDOCAINE HCL (PF) 1 % IJ SOLN
5.0000 mL | Freq: Once | INTRAMUSCULAR | Status: AC
  Administered 2019-01-19: 5 mL via INTRADERMAL
  Filled 2019-01-19: qty 5

## 2019-01-19 MED ORDER — MORPHINE SULFATE (PF) 4 MG/ML IV SOLN
4.0000 mg | Freq: Once | INTRAVENOUS | Status: AC
Start: 2019-01-19 — End: 2019-01-19
  Administered 2019-01-19: 4 mg via INTRAVENOUS
  Filled 2019-01-19: qty 1

## 2019-01-19 MED ORDER — ONDANSETRON HCL 4 MG/2ML IJ SOLN
4.0000 mg | Freq: Once | INTRAMUSCULAR | Status: AC
  Administered 2019-01-19: 4 mg via INTRAVENOUS
  Filled 2019-01-19: qty 2

## 2019-01-19 MED ORDER — PREDNISONE 20 MG PO TABS: 60.0000 mg | ORAL_TABLET | Freq: Every day | ORAL | 0 refills | Status: AC

## 2019-01-19 MED ORDER — IBUPROFEN 600 MG PO TABS: 600.0000 mg | ORAL_TABLET | Freq: Four times a day (QID) | ORAL | 0 refills | Status: DC | PRN

## 2019-01-19 MED ORDER — AMOXICILLIN-POT CLAVULANATE 875-125 MG PO TABS: 1.0000 | ORAL_TABLET | Freq: Two times a day (BID) | ORAL | 0 refills | Status: AC

## 2019-01-19 MED ORDER — KETOROLAC TROMETHAMINE 30 MG/ML IJ SOLN
15.0000 mg | Freq: Once | INTRAMUSCULAR | Status: AC
  Administered 2019-01-19: 15 mg via INTRAVENOUS
  Filled 2019-01-19: qty 1

## 2019-01-19 MED ORDER — SODIUM CHLORIDE 0.9 % IV SOLN
3.0000 g | Freq: Once | INTRAVENOUS | Status: AC
  Administered 2019-01-19: 07:00:00 3 g via INTRAVENOUS
  Filled 2019-01-19: qty 8

## 2019-01-19 MED ORDER — DEXAMETHASONE SODIUM PHOSPHATE 10 MG/ML IJ SOLN
10.0000 mg | Freq: Once | INTRAMUSCULAR | Status: AC
  Administered 2019-01-19: 10 mg via INTRAVENOUS
  Filled 2019-01-19: qty 1

## 2019-01-19 MED ORDER — IOHEXOL 300 MG/ML  SOLN
75.0000 mL | Freq: Once | INTRAMUSCULAR | Status: AC | PRN
  Administered 2019-01-19: 75 mL via INTRAVENOUS

## 2019-01-19 NOTE — ED Provider Notes (Signed)
[facility] Emergency Department Provider Note  ____________________________________________  Time seen: Approximately 6:36 AM  I have reviewed the triage vital signs and the nursing notes.   HISTORY  Chief Complaint Dental Pain   HPI Ryan Mcknight is a 28 y.o. male with history as listed below who presents for evaluation of left-sided facial swelling.  Patient reports that he started having pain in his molar 2 days ago.  Yesterday noticed some swelling.  Came to the emergency room however left without being seen due to long wait times.  He went to sleep and woke up with worsening swelling.  He is complaining of sharp throbbing pain that is located around the area of the left upper molar.  The pain is severe.  The pain is associated with significant swelling of the right side of the face.  No difficulty swallowing or breathing, no drooling, no fever or chills.   Past Medical History:  Diagnosis Date   Bipolar 1 disorder ([place])    Depression    Drug abuse ([place])    Schizoaffective disorder ([place])     There are no active problems to display for this patient.   Past Surgical History:  Procedure Laterality Date   DENTAL SURGERY      Prior to Admission medications   Medication Sig Start Date End Date Taking? Authorizing Provider  amoxicillin-clavulanate (AUGMENTIN) 875-125 MG tablet Take 1 tablet by mouth 2 (two) times daily for 10 days. 01/19/19 01/29/19  Rudene Re, MD  dextromethorphan (COUGH SUPPRESSANT) 15 MG/5ML syrup Take 10 mLs (30 mg total) by mouth 4 (four) times daily as needed for cough. 06/25/12   Margarita Mail, PA-C  divalproex (DEPAKOTE) 250 MG DR tablet Take 250 mg by mouth 3 (three) times daily.    [provider]  famotidine (PEPCID) 40 MG tablet Take 1 tablet (40 mg total) by mouth at bedtime. 12/11/18 01/10/19  Rudene Re, MD  guaiFENesin (MUCINEX) 600 MG 12 hr tablet Take 2 tablets (1,200 mg total) by mouth 2  (two) times daily. 06/25/12   Harris, Vernie Shanks, PA-C  ibuprofen (ADVIL) 600 MG tablet Take 1 tablet (600 mg total) by mouth every 6 (six) hours as needed. 01/19/19   Rudene Re, MD  mirtazapine (REMERON) 15 MG tablet Take 15 mg by mouth at bedtime.    [provider]  naproxen (NAPROSYN) 500 MG tablet Take 500 mg by mouth 2 (two) times daily with a meal.    [provider]  OLANZapine (ZYPREXA) 10 MG tablet Take 10 mg by mouth at bedtime.    [provider]  predniSONE (DELTASONE) 20 MG tablet Take 3 tablets (60 mg total) by mouth daily for 4 days. 01/19/19 01/23/19  Rudene Re, MD  traMADol (ULTRAM) 50 MG tablet Take 1 tablet (50 mg total) by mouth every 6 (six) hours as needed for pain. 07/10/12   Pisciotta, Elmyra Ricks, PA-C    Allergies Patient has no known allergies.  No family history on file.  Social History Social History   Tobacco Use   Smoking status: Current Every Day Smoker   Smokeless tobacco: Never Used  Substance Use Topics   Alcohol use: No   Drug use: Not Currently    Review of Systems  Constitutional: Negative for fever. Eyes: Negative for visual changes. ENT: Negative for sore throat. + dental pain and facial swelling Neck: No neck pain  Cardiovascular: Negative for chest pain. Respiratory: Negative for shortness of breath. Gastrointestinal: Negative for abdominal pain, vomiting    >onell SievertPauline [place]m  Terrilee [id number] Donell SievertPauline [place]15m  Terrilee [id number]  cellulitic change. Orbits: Negative Sinuses: Mild mucosal thickening on the floor of the left maxillary sinus, likely odontogenic Soft tissues: As above Limited intracranial: Negative IMPRESSION: Odontogenic infection related to tooth 13 with 14 x 9 mm abscess along the buccal surface. Electronically Signed   By: Marnee SpringJonathon  Watts M.D.   On: 01/19/2019 07:09      ____________________________________________   PROCEDURES  Procedure(s) performed: None Procedures Critical Care performed:  None ____________________________________________   INITIAL IMPRESSION / ASSESSMENT AND PLAN / ED COURSE  28 y.o. male with history as listed below who presents for evaluation of left-sided facial swelling and left upper molar pain x2 days.  Patient has significant swelling of the left side of the face with no erythema or warmth, no signs of Ludewig's angina.  Patient has several cavities and fractured teeth.  No clinical evidence of parotitis.  Concerning for periodontal abscess.  No signs of sepsis with no tachycardia or fever.  Patient has mild  leukocytosis.  Due to severe swelling CT of the face was ordered.  Patient will be given IV steroids, Toradol, and Unasyn.     _________________________ 7:17 AM on 01/19/2019 -----------------------------------------  CT pending. Care transferred to Dr. Roxan Hockeyobinson.    As part of my medical decision making, I reviewed the following data within the electronic MEDICAL RECORD NUMBER Nursing notes reviewed and incorporated, Labs reviewed , Radiograph reviewed , Notes from prior ED visits and [place] Controlled Substance Database   Patient was evaluated in Emergency Department today for the symptoms described in the history of present illness. Patient was evaluated in the context of the global COVID-19 pandemic, which necessitated consideration that the patient might be at risk for infection with the SARS-CoV-2 virus that causes COVID-19. Institutional protocols and algorithms that pertain to the evaluation of patients at risk for COVID-19 are in a state of rapid change based on information released by regulatory bodies including the CDC and federal and state organizations. These policies and algorithms were followed during the patient's care in the ED.   ____________________________________________   FINAL CLINICAL IMPRESSION(S) / ED DIAGNOSES   Final diagnoses:  Dental infection      NEW MEDICATIONS STARTED DURING THIS VISIT:  ED Discharge Orders         Ordered    amoxicillin-clavulanate (AUGMENTIN) 875-125 MG tablet  2 times daily     01/19/19 0704    predniSONE (DELTASONE) 20 MG tablet  Daily     01/19/19 0704    ibuprofen (ADVIL) 600 MG tablet  Every 6 hours PRN     01/19/19 [id number]           Note:  This document was prepared using Dragon voice recognition software and may include unintentional dictation errors.    Don PerkingVeronese, [place], MD 01/19/19 [phone]

## 2019-01-19 NOTE — ED Provider Notes (Signed)
..  Incision and Drainage  Date/Time: 01/19/2019 7:35 AM Performed by: Merlyn Lot, MD Authorized by: Merlyn Lot, MD   Consent:    Consent obtained:  Verbal   Consent given by:  Patient   Risks discussed:  Bleeding, infection, incomplete drainage and pain   Alternatives discussed:  Alternative treatment, delayed treatment and observation Location:    Type:  Abscess   Location:  Mouth   Mouth location:  Alveolar process Anesthesia (see MAR for exact dosages):    Anesthesia method:  Local infiltration   Local anesthetic:  Lidocaine 1% w/o epi Procedure type:    Complexity:  Simple Procedure details:    Incision types:  Stab incision   Incision depth:  Submucosal   Scalpel blade:  11   Drainage:  Purulent   Drainage amount:  Moderate   Wound treatment:  Wound left open   Packing materials:  None Post-procedure details:    Patient tolerance of procedure:  Tolerated well, no immediate complications   Patient with evidence of periapical abscess on CT and exam shows focal point, just above tooth #13 which was I indeed with purulent drainage.  Patient received antibiotics feels much improved.  Will be discharged home on antibiotics.  He has follow-up with dentist.      Merlyn Lot, MD 01/19/19 [phone]

## 2019-01-19 NOTE — ED Triage Notes (Signed)
Patient ambulatory to triage with steady gait, without difficulty or distress noted, mask in place; pt reports recent left upper dental pain; now with increased swelling to left side face; pt was here earlier tonight but left prior to triage due to long wait; st awoke with increased swelling so came back; was seen by dentist mo again and given antibiotics and has appt next wk for further care

## 2019-01-19 NOTE — ED Notes (Signed)
Patient transported to CT 

## 2019-01-19 NOTE — Discharge Instructions (Signed)
Follow-up with your dentist in 24 hours.  Take medication as prescribed.  Return to the emergency room for worsening swelling, fever, redness of the skin, difficulty opening her mouth, difficulty swallowing, difficulty breathing.

## 2019-09-17 ENCOUNTER — Emergency Department
Admission: EM | Admit: 2019-09-17 | Discharge: 2019-09-17 | Disposition: A | Discharge status: Home / Self Care | Payer: Self-pay | Attending: Emergency Medicine | Admitting: Emergency Medicine

## 2019-09-17 ENCOUNTER — Other Ambulatory Visit: Payer: Self-pay

## 2019-09-17 ENCOUNTER — Encounter: Payer: Self-pay | Admitting: *Deleted

## 2019-09-17 DIAGNOSIS — K0889 Other specified disorders of teeth and supporting structures: Secondary | ICD-10-CM | POA: Insufficient documentation

## 2019-09-17 DIAGNOSIS — Z5321 Procedure and treatment not carried out due to patient leaving prior to being seen by health care provider: Secondary | ICD-10-CM | POA: Insufficient documentation

## 2019-09-17 DIAGNOSIS — R220 Localized swelling, mass and lump, head: Secondary | ICD-10-CM | POA: Insufficient documentation

## 2019-09-17 NOTE — ED Triage Notes (Signed)
Pt states he took amoxicillin that he was previously rx'd for same complaint of dental abscess x 30 mins ago. Pt did not finish prior course of abx.

## 2019-09-17 NOTE — ED Triage Notes (Signed)
Pt presents w/ swelling to L face between eye and lips. Pt endorses dental pain and is concerned that he has an abscess r/t his dental caries.

## 2019-09-17 NOTE — ED Triage Notes (Signed)
Pt took ibuprofen 200 mg x 2 tabs approximatelyu 2.5 hrs ago w/ minimal relief.

## 2019-09-19 ENCOUNTER — Encounter: Payer: Self-pay | Admitting: Emergency Medicine

## 2019-09-19 ENCOUNTER — Other Ambulatory Visit: Payer: Self-pay

## 2019-09-19 ENCOUNTER — Emergency Department
Admission: EM | Admit: 2019-09-19 | Discharge: 2019-09-19 | Disposition: A | Discharge status: Home / Self Care | Payer: Medicaid Other | Attending: Emergency Medicine | Admitting: Emergency Medicine

## 2019-09-19 DIAGNOSIS — F17210 Nicotine dependence, cigarettes, uncomplicated: Secondary | ICD-10-CM | POA: Insufficient documentation

## 2019-09-19 DIAGNOSIS — K047 Periapical abscess without sinus: Secondary | ICD-10-CM | POA: Insufficient documentation

## 2019-09-19 DIAGNOSIS — Z79899 Other long term (current) drug therapy: Secondary | ICD-10-CM | POA: Insufficient documentation

## 2019-09-19 MED ORDER — LIDOCAINE VISCOUS HCL 2 % MT SOLN
15.0000 mL | Freq: Once | OROMUCOSAL | Status: AC
  Administered 2019-09-19: 15:00:00 15 mL via OROMUCOSAL
  Filled 2019-09-19: qty 15

## 2019-09-19 MED ORDER — LIDOCAINE VISCOUS HCL 2 % MT SOLN: 5.0000 mL | Freq: Four times a day (QID) | OROMUCOSAL | 0 refills | Status: DC | PRN

## 2019-09-19 MED ORDER — IBUPROFEN 600 MG PO TABS: 600.0000 mg | ORAL_TABLET | Freq: Three times a day (TID) | ORAL | 0 refills | Status: DC | PRN

## 2019-09-19 MED ORDER — AMOXICILLIN 500 MG PO CAPS: 500.0000 mg | ORAL_CAPSULE | Freq: Three times a day (TID) | ORAL | 0 refills | Status: DC

## 2019-09-19 MED ORDER — TRAMADOL HCL 50 MG PO TABS: 50.0000 mg | ORAL_TABLET | Freq: Four times a day (QID) | ORAL | 0 refills | Status: DC | PRN

## 2019-09-19 NOTE — Discharge Instructions (Addendum)
Advised to follow-up for list of dental clinics provided discharge care instruction.  Take medication as directed. OPTIONS FOR DENTAL FOLLOW UP CARE  [place] Department of Health and Human Services - Local Safety Net Dental Clinics [email or web address]   [facility] [phone])  [facility] [phone])  [place] [phone])  [facility] [phone])  [facility] [phone]) This clinic caters to the indigent population and is on a lottery system. Location: [employer] of Dentistry, [employer], [address] [place] Clinic Hours: Wednesdays from 6pm - 9pm, patients seen by a lottery system. For dates, call or go to [email or web address] Services: Cleanings, fillings and simple extractions. Payment Options: DENTAL WORK IS FREE OF CHARGE. Bring proof of income or support. Best way to get seen: Arrive at 5:15 pm - this is a lottery, NOT first come/first serve, so arriving earlier will not increase your chances of being seen.     [facility] [phone] Select option 1 for emergencies   Location: [facility] of Dentistry, [place], [address] [place] Clinic Hours: No walk-ins accepted - call the day before to schedule an appointment. Check in times are 9:30 am and 1:30 pm. Services: Simple extractions, temporary fillings, pulpectomy/pulp debridement, uncomplicated abscess drainage. Payment Options: PAYMENT IS DUE AT THE TIME OF SERVICE.  Fee is usually $100-200, additional surgical procedures (e.g. abscess drainage) may be extra. Cash, checks, Visa/MasterCard accepted.  Can file Medicaid if patient is covered for dental - patient should call case worker to check. No discount for [facility] patients. Best way to get seen: MUST call the day before and get onto the schedule. Can  usually be seen the next 1-2 days. No walk-ins accepted.     [facility] [phone]   Location: [facility], [address] [place] Clinic Hours: M, W, Th, F 8am or 1:30pm, Tues 9a or 1:30 - first come/first served. Services: Simple extractions, temporary fillings, uncomplicated abscess drainage.  You do not need to be an [facility] resident. Payment Options: PAYMENT IS DUE AT THE TIME OF SERVICE. Dental insurance, otherwise sliding scale - bring proof of income or support. Depending on income and treatment needed, cost is usually $50-200. Best way to get seen: Arrive early as it is first come/first served.     [facility] [phone]   Location: [address] Clinic Hours: Mon-Thu 8a-5p Services: Most basic dental services including extractions and fillings. Payment Options: PAYMENT IS DUE AT THE TIME OF SERVICE. Sliding scale, up to 50% off - bring proof if income or support. Medicaid with dental option accepted. Best way to get seen: Call to schedule an appointment, can usually be seen within 2 weeks OR they will try to see walk-ins - show up at 8a or 2p (you may have to wait).     [facility] [phone] [place] RESIDENTS ONLY   Location: [facility], [address], [place], [place] [address] Clinic Hours: By appointment only. Monday - Thursday 8am-5pm, Friday 8am-12pm Services: Cleanings, fillings, extractions. Payment Options: PAYMENT IS DUE AT THE TIME OF SERVICE. Cash, Visa or MasterCard. Sliding scale - $30 minimum per service. Best way to get seen: Come in to office, complete packet and make an appointment - need proof of income or support monies for each household member and proof of [facility] residence. Usually takes about a month to get  in.     [facility] [phone]   Location: [address] [facility] Clinic Hours: Walk-in Urgent Care Dental Services are offered Monday-Friday mornings only. The numbers of emergencies accepted daily is limited to the number of providers available. Maximum 15 - Mondays, Wednesdays & Thursdays Maximum 10 - Tuesdays & Fridays Services: You do not need to be a [facility] resident to be seen for a dental emergency. Emergencies are defined as pain, swelling, abnormal bleeding, or dental trauma. Walkins will receive x-rays if needed. NOTE: Dental cleaning is not an emergency. Payment Options: PAYMENT IS DUE AT THE TIME OF SERVICE. Minimum co-pay is $40.00 for uninsured patients. Minimum co-pay is $3.00 for Medicaid with dental coverage. Dental Insurance is accepted and must be presented at time of visit. Medicare does not cover dental. Forms of payment: Cash, credit card, checks. Best way to get seen: If not previously registered with the clinic, walk-in dental registration begins at 7:15 am and is on a first come/first serve basis. If previously registered with the clinic, call to make an appointment.     [facility] [phone] [place] RESIDENTS ONLY   Location: [address], [place], [place] Clinic Hours: Mon-Thu 10a-2p Services: Extractions only! Payment Options: FREE (donations accepted) - bring proof of income or support Best way to get seen: Call and schedule an appointment OR come at 8am on the 1st Monday of every month (except for holidays) when it is first come/first served.     [facility] [phone]   Location: [address] [place], [place] Clinic Hours: Friday mornings Services, Payment Options, Best way to get seen: Call for info

## 2019-09-19 NOTE — ED Provider Notes (Signed)
[facility] Emergency Department Provider Note   ____________________________________________   First MD Initiated Contact with Patient 09/19/19 1458     (approximate)  I have reviewed the triage vital signs and the nursing notes.   HISTORY  Chief Complaint Dental Pain    HPI Ryan Mcknight is a 29 y.o. male patient complain pain to the left upper jaw for approximately a week secondary to devitalized upper molar.  Patient denies fever associated complaint.  Patient rates pain as 8/10.  Patient described pain is "achy".  No palliative measure for complaint.         Past Medical History:  Diagnosis Date  . Bipolar 1 disorder ([facility])   . Depression   . Drug abuse ([facility])   . Schizoaffective disorder ([facility])     There are no problems to display for this patient.   Past Surgical History:  Procedure Laterality Date  . DENTAL SURGERY      Prior to Admission medications   Medication Sig Start Date End Date Taking? Authorizing Provider  amoxicillin (AMOXIL) 500 MG capsule Take 1 capsule (500 mg total) by mouth 3 (three) times daily. 09/19/19   Joni Reining, PA-C  dextromethorphan (COUGH SUPPRESSANT) 15 MG/5ML syrup Take 10 mLs (30 mg total) by mouth 4 (four) times daily as needed for cough. 06/25/12   Arthor Captain, PA-C  divalproex (DEPAKOTE) 250 MG DR tablet Take 250 mg by mouth 3 (three) times daily.    [provider]  famotidine (PEPCID) 40 MG tablet Take 1 tablet (40 mg total) by mouth at bedtime. 12/11/18 01/10/19  Nita Sickle, MD  guaiFENesin (MUCINEX) 600 MG 12 hr tablet Take 2 tablets (1,200 mg total) by mouth 2 (two) times daily. 06/25/12   Harris, Cammy Copa, PA-C  ibuprofen (ADVIL) 600 MG tablet Take 1 tablet (600 mg total) by mouth every 8 (eight) hours as needed. 09/19/19   Joni Reining, PA-C  lidocaine (XYLOCAINE) 2 % solution Use as directed 5 mLs in the mouth or throat every 6 (six) hours as needed for mouth pain. 09/19/19    Joni Reining, PA-C  mirtazapine (REMERON) 15 MG tablet Take 15 mg by mouth at bedtime.    [provider]  naproxen (NAPROSYN) 500 MG tablet Take 500 mg by mouth 2 (two) times daily with a meal.    [provider]  OLANZapine (ZYPREXA) 10 MG tablet Take 10 mg by mouth at bedtime.    [provider]  traMADol (ULTRAM) 50 MG tablet Take 1 tablet (50 mg total) by mouth every 6 (six) hours as needed. 09/19/19 09/18/20  Joni Reining, PA-C    Allergies Patient has no known allergies.  No family history on file.  Social History Social History   Tobacco Use  . Smoking status: Current Every Day Smoker  . Smokeless tobacco: Never Used  Substance Use Topics  . Alcohol use: No  . Drug use: Not Currently    Review of Systems Constitutional: No fever/chills Eyes: No visual changes. ENT: No sore throat.  Dental pain. Cardiovascular: Denies chest pain. Respiratory: Denies shortness of breath. Gastrointestinal: No abdominal pain.  No nausea, no vomiting.  No diarrhea.  No constipation. Genitourinary: Negative for dysuria. Musculoskeletal: Negative for back pain. Skin: Negative for rash. Neurological: Negative for headaches, focal weakness or numbness. Allergic/Immunilogical: Tylenol ____________________________________________   PHYSICAL EXAM:  VITAL SIGNS: ED Triage Vitals  Enc Vitals Group     BP 09/19/19 1442 131/85  Pulse Rate 09/19/19 1442 (!) 46     Resp 09/19/19 1442 20     Temp 09/19/19 1442 98.2 F (36.8 C)     Temp Source 09/19/19 1442 Oral     SpO2 09/19/19 1442 99 %     Weight 09/19/19 1443 196 lb (88.9 kg)     Height 09/19/19 1443 5\' 9"  (1.753 m)     Head Circumference --      Peak Flow --      Pain Score 09/19/19 1443 8     Pain Loc --      Pain Edu? --      Excl. in [place]? --     Constitutional: Alert and oriented. Well appearing and in no acute distress. Mouth/Throat: Mucous membranes are moist.  Oropharynx non-erythematous.   Devitalized tooth #4 Neck: No stridor.   Hematological/Lymphatic/Immunilogical: No cervical lymphadenopathy. Cardiovascular: Normal rate, regular rhythm. Grossly normal heart sounds.  Good peripheral circulation. Respiratory: Normal respiratory effort.  No retractions. Lungs CTAB. Neurologic:  Normal speech and language. No gross focal neurologic deficits are appreciated. No gait instability. Skin:  Skin is warm, dry and intact. No rash noted. Psychiatric: Mood and affect are normal. Speech and behavior are normal.  ____________________________________________   LABS (all labs ordered are listed, but only abnormal results are displayed)  Labs Reviewed - No data to display ____________________________________________  EKG   ____________________________________________  RADIOLOGY  ED MD interpretation:    Official radiology report(s): No results found.  ____________________________________________   PROCEDURES  Procedure(s) performed (including Critical Care):  Procedures   ____________________________________________   INITIAL IMPRESSION / ASSESSMENT AND PLAN / ED COURSE  As part of my medical decision making, I reviewed the following data within the [place]     Patient presents with dental pain secondary to devitalized upper molars.  Patient given discharge care instructions advised take medications as directed.  Patient advised to establish care from list of dental clinics provided discharge care instructions.    Ryan Mcknight was evaluated in Emergency Department on 09/19/2019 for the symptoms described in the history of present illness. He was evaluated in the context of the global COVID-19 pandemic, which necessitated consideration that the patient might be at risk for infection with the SARS-CoV-2 virus that causes COVID-19. Institutional protocols and algorithms that pertain to the evaluation of patients at risk for COVID-19 are in a state  of rapid change based on information released by regulatory bodies including the CDC and federal and state organizations. These policies and algorithms were followed during the patient's care in the ED.       ____________________________________________   FINAL CLINICAL IMPRESSION(S) / ED DIAGNOSES  Final diagnoses:  Dental infection     ED Discharge Orders         Ordered    amoxicillin (AMOXIL) 500 MG capsule  3 times daily     09/19/19 1505    ibuprofen (ADVIL) 600 MG tablet  Every 8 hours PRN     09/19/19 1505    traMADol (ULTRAM) 50 MG tablet  Every 6 hours PRN     09/19/19 1505    lidocaine (XYLOCAINE) 2 % solution  Every 6 hours PRN     09/19/19 1505           Note:  This document was prepared using Dragon voice recognition software and may include unintentional dictation errors.    Sable Feil, PA-C 09/19/19 1524    Harvest Dark, MD 09/20/19  2030  

## 2019-09-19 NOTE — ED Triage Notes (Signed)
Pt reports dental abscess to left upper jaw for the pasty week or more. Pt reports was her the other night but the wait was to long.

## 2019-09-19 NOTE — ED Notes (Signed)
See triage note  Presents with possible dental abscess   Swelling noted to gum line

## 2019-11-02 ENCOUNTER — Other Ambulatory Visit: Payer: Self-pay

## 2019-11-05 ENCOUNTER — Emergency Department
Admission: EM | Admit: 2019-11-05 | Discharge: 2019-11-05 | Disposition: A | Discharge status: Home / Self Care | Payer: Medicaid Other | Attending: Emergency Medicine | Admitting: Emergency Medicine

## 2019-11-05 ENCOUNTER — Other Ambulatory Visit: Payer: Self-pay

## 2019-11-05 DIAGNOSIS — R319 Hematuria, unspecified: Secondary | ICD-10-CM | POA: Insufficient documentation

## 2019-11-05 DIAGNOSIS — Z5321 Procedure and treatment not carried out due to patient leaving prior to being seen by health care provider: Secondary | ICD-10-CM | POA: Insufficient documentation

## 2019-11-05 DIAGNOSIS — R42 Dizziness and giddiness: Secondary | ICD-10-CM | POA: Insufficient documentation

## 2019-11-05 LAB — URINALYSIS, COMPLETE (UACMP) WITH MICROSCOPIC
Bilirubin Urine: NEGATIVE
Glucose, UA: NEGATIVE mg/dL
Ketones, ur: NEGATIVE mg/dL
Nitrite: NEGATIVE
Protein, ur: 30 mg/dL — AB
RBC / HPF: >50 RBC/hpf — ABNORMAL HIGH (ref 0–5)
Specific Gravity, Urine: 1.018 (ref 1.005–1.030)
Squamous Epithelial / HPF: NONE SEEN (ref 0–5)
pH: 7 (ref 5.0–8.0)

## 2019-11-05 LAB — BASIC METABOLIC PANEL
Anion gap: 5 (ref 5–15)
BUN: 12 mg/dL (ref 6–20)
CO2: 27 mmol/L (ref 22–32)
Calcium: 9 mg/dL (ref 8.9–10.3)
Chloride: 107 mmol/L (ref 98–111)
Creatinine, Ser: 0.94 mg/dL (ref 0.61–1.24)
GFR calc Af Amer: >60 mL/min (ref >60)
GFR calc non Af Amer: >60 mL/min (ref >60)
Glucose, Bld: 75 mg/dL (ref 70–99)
Potassium: 3.6 mmol/L (ref 3.5–5.1)
Sodium: 139 mmol/L (ref 135–145)

## 2019-11-05 LAB — CBC
HCT: 43 % (ref 39.0–52.0)
Hemoglobin: 14.4 g/dL (ref 13.0–17.0)
MCH: 30.5 pg (ref 26.0–34.0)
MCHC: 33.5 g/dL (ref 30.0–36.0)
MCV: 91.1 fL (ref 80.0–100.0)
Platelets: 250 10*3/uL (ref 150–400)
RBC: 4.72 MIL/uL (ref 4.22–5.81)
RDW: 12.2 % (ref 11.5–15.5)
WBC: 9.1 10*3/uL (ref 4.0–10.5)
nRBC: 0 % (ref 0.0–0.2)

## 2019-11-05 NOTE — ED Triage Notes (Addendum)
Pt arrives from home via POV. Pt c/o peeing blood, pt states he "felt light headed when it happened because it was a lot of blood and pain". Pt denies loss of consciousness, or falling. Pt denies pain, but Pt states he feels light headed now.   Pt states he received a "shot in the arm" for tx of chlamydia/gonorrhea 11/04/2019 at 1130.  Ryan Mcknight

## 2019-11-05 NOTE — ED Notes (Signed)
Pt called in the WR with no response 

## 2019-11-05 NOTE — ED Notes (Signed)
Pt called in the WR with no response, pt not visualized.

## 2019-11-05 NOTE — ED Notes (Signed)
Pt called in the WR with no response, pt is not visualized.  

## 2019-12-28 ENCOUNTER — Other Ambulatory Visit: Payer: Self-pay

## 2019-12-28 ENCOUNTER — Emergency Department
Admission: EM | Admit: 2019-12-28 | Discharge: 2019-12-28 | Disposition: A | Discharge status: Home / Self Care | Payer: Medicaid Other | Attending: Emergency Medicine | Admitting: Emergency Medicine

## 2019-12-28 ENCOUNTER — Encounter: Payer: Self-pay | Admitting: Emergency Medicine

## 2019-12-28 DIAGNOSIS — Z202 Contact with and (suspected) exposure to infections with a predominantly sexual mode of transmission: Secondary | ICD-10-CM | POA: Insufficient documentation

## 2019-12-28 DIAGNOSIS — Z87891 Personal history of nicotine dependence: Secondary | ICD-10-CM | POA: Insufficient documentation

## 2019-12-28 DIAGNOSIS — A64 Unspecified sexually transmitted disease: Secondary | ICD-10-CM

## 2019-12-28 LAB — URINALYSIS, COMPLETE (UACMP) WITH MICROSCOPIC
Bacteria, UA: NONE SEEN
Bilirubin Urine: NEGATIVE
Glucose, UA: NEGATIVE mg/dL
Hgb urine dipstick: NEGATIVE
Ketones, ur: NEGATIVE mg/dL
Leukocytes,Ua: NEGATIVE
Nitrite: NEGATIVE
Protein, ur: NEGATIVE mg/dL
Specific Gravity, Urine: 1.018 (ref 1.005–1.030)
Squamous Epithelial / HPF: NONE SEEN (ref 0–5)
pH: 6 (ref 5.0–8.0)

## 2019-12-28 LAB — CHLAMYDIA/NGC RT PCR (ARMC ONLY)
Chlamydia Tr: NOT DETECTED
N gonorrhoeae: NOT DETECTED

## 2019-12-28 LAB — HIV ANTIBODY (ROUTINE TESTING W REFLEX): HIV Screen 4th Generation wRfx: NONREACTIVE

## 2019-12-28 MED ORDER — LIDOCAINE HCL (PF) 1 % IJ SOLN
INTRAMUSCULAR | Status: AC
  Administered 2019-12-28: 5 mL
  Filled 2019-12-28: qty 5

## 2019-12-28 MED ORDER — LIDOCAINE HCL (PF) 1 % IJ SOLN: 5.0000 mL | Freq: Once | INTRAMUSCULAR | Status: AC

## 2019-12-28 MED ORDER — CEFTRIAXONE SODIUM 250 MG IJ SOLR
250.0000 mg | Freq: Once | INTRAMUSCULAR | Status: AC
  Administered 2019-12-28: 250 mg via INTRAMUSCULAR
  Filled 2019-12-28: qty 250

## 2019-12-28 MED ORDER — AZITHROMYCIN 500 MG PO TABS
1000.0000 mg | ORAL_TABLET | Freq: Once | ORAL | Status: AC
  Administered 2019-12-28: 1000 mg via ORAL
  Filled 2019-12-28: qty 2

## 2019-12-28 NOTE — ED Triage Notes (Signed)
Patient ambulatory to triage with steady gait, without difficulty or distress noted; pt reports wanting to be checked for STD; c/o dysuria

## 2019-12-28 NOTE — ED Provider Notes (Signed)
[facility] Emergency Department Provider Note  ____________________________________________   First MD Initiated Contact with Patient 12/28/19 [phone]     (approximate)  I have reviewed the triage vital signs and the nursing notes.   HISTORY  Chief Complaint SEXUALLY TRANSMITTED DISEASE    HPI Ryan Mcknight is a 29 y.o. male with below list of previous medical conditions including schizoaffective disorder bipolar disorder, gonorrhea, and chlamydia presents to the emergency department secondary to dysuria and penile lesions noted today.  Patient states that he had unprotected sex with a new sexual partner 3 days ago.  Patient states that sexual partner denies any symptoms.  Patient denies any fever.        Past Medical History:  Diagnosis Date  . Bipolar 1 disorder (HCC)   . Depression   . Drug abuse (HCC)   . Schizoaffective disorder (HCC)     There are no problems to display for this patient.   Past Surgical History:  Procedure Laterality Date  . DENTAL SURGERY      Prior to Admission medications   Medication Sig Start Date End Date Taking? Authorizing Provider  amoxicillin (AMOXIL) 500 MG capsule Take 1 capsule (500 mg total) by mouth 3 (three) times daily. 09/19/19   Joni Reining, PA-C  dextromethorphan (COUGH SUPPRESSANT) 15 MG/5ML syrup Take 10 mLs (30 mg total) by mouth 4 (four) times daily as needed for cough. 06/25/12   Arthor Captain, PA-C  divalproex (DEPAKOTE) 250 MG DR tablet Take 250 mg by mouth 3 (three) times daily.    [provider]  famotidine (PEPCID) 40 MG tablet Take 1 tablet (40 mg total) by mouth at bedtime. 12/11/18 01/10/19  Nita Sickle, MD  guaiFENesin (MUCINEX) 600 MG 12 hr tablet Take 2 tablets (1,200 mg total) by mouth 2 (two) times daily. 06/25/12   Harris, Cammy Copa, PA-C  ibuprofen (ADVIL) 600 MG tablet Take 1 tablet (600 mg total) by mouth every 8 (eight) hours as needed. 09/19/19   Joni Reining,  PA-C  lidocaine (XYLOCAINE) 2 % solution Use as directed 5 mLs in the mouth or throat every 6 (six) hours as needed for mouth pain. 09/19/19   Joni Reining, PA-C  mirtazapine (REMERON) 15 MG tablet Take 15 mg by mouth at bedtime.    [provider]  naproxen (NAPROSYN) 500 MG tablet Take 500 mg by mouth 2 (two) times daily with a meal.    [provider]  OLANZapine (ZYPREXA) 10 MG tablet Take 10 mg by mouth at bedtime.    [provider]  traMADol (ULTRAM) 50 MG tablet Take 1 tablet (50 mg total) by mouth every 6 (six) hours as needed. 09/19/19 09/18/20  Joni Reining, PA-C    Allergies Acetaminophen  No family history on file.  Social History Social History   Tobacco Use  . Smoking status: Former Games developer  . Smokeless tobacco: Never Used  Vaping Use  . Vaping Use: Every day  . Substances: Nicotine  Substance Use Topics  . Alcohol use: Not Currently    Comment: "rarely"  . Drug use: Yes    Types: Marijuana    Comment: daily    Review of Systems Constitutional: No fever/chills Eyes: No visual changes. ENT: No sore throat. Cardiovascular: Denies chest pain. Respiratory: Denies shortness of breath. Gastrointestinal: No abdominal pain.  No nausea, no vomiting.  No diarrhea.  No constipation. Genitourinary: Positive for dysuria and penile lesions Musculoskeletal: Negative for neck pain.  Negative  for back pain. Integumentary: Negative for rash. Neurological: Negative for headaches, focal weakness or numbness.  ____________________________________________   PHYSICAL EXAM:  VITAL SIGNS: ED Triage Vitals  Enc Vitals Group     BP 12/28/19 0041 131/87     Pulse Rate 12/28/19 0041 86     Resp 12/28/19 0041 16     Temp 12/28/19 0041 98.1 F (36.7 C)     Temp Source 12/28/19 0041 Oral     SpO2 12/28/19 0041 97 %     Weight 12/28/19 0040 79.4 kg (175 lb)     Height 12/28/19 0040 1.803 m (5\' 11" )     Head Circumference --      Peak Flow --       Pain Score 12/28/19 0040 0     Pain Loc --      Pain Edu? --      Excl. in GC? --     Constitutional: Alert and oriented.  Eyes: Conjunctivae are normal.  Head: Atraumatic. Mouth/Throat: Patient is wearing a mask. Neck: No stridor.  No meningeal signs.   Cardiovascular: Normal rate, regular rhythm. Good peripheral circulation. Grossly normal heart sounds. Respiratory: Normal respiratory effort.  No retractions. Gastrointestinal: Soft and nontender. No distention.  Genitourinary: Shallow-based ulcer disease noted on the glans penis and shaft Musculoskeletal: No lower extremity tenderness nor edema. No gross deformities of extremities. Neurologic:  Normal speech and language. No gross focal neurologic deficits are appreciated.  Skin:  Skin is warm, dry and intact. Psychiatric: Mood and affect are normal. Speech and behavior are normal.  ____________________________________________   LABS (all labs ordered are listed, but only abnormal results are displayed)  Labs Reviewed  URINALYSIS, COMPLETE (UACMP) WITH MICROSCOPIC - Abnormal; Notable for the following components:      Result Value   Color, Urine YELLOW (*)    APPearance CLEAR (*)    All other components within normal limits  CHLAMYDIA/NGC RT PCR ([facility] ONLY)  RPR  HIV ANTIBODY (ROUTINE TESTING W REFLEX)  HSV 2 ANTIBODY, IGG     Procedures   ____________________________________________   INITIAL IMPRESSION / MDM / ASSESSMENT AND PLAN / ED COURSE  As part of my medical decision making, I reviewed the following data within the electronic MEDICAL RECORD NUMBER  29 year old male presented with above-stated history and physical exam differential diagnosis including but not limited to chlamydia gonorrhea herpes and syphilis.  Patient's gonorrhea chlamydia urine sample negative here given the presence of shallow-based ulcers on the glans penis and on the shaft concern for possible syphilis versus herpes.  As such  appropriate blood test for both were ordered.  Patient advised to follow-up with those laboratory tests.  ____________________________________________  FINAL CLINICAL IMPRESSION(S) / ED DIAGNOSES  Final diagnoses:  STD (male)     MEDICATIONS GIVEN DURING THIS VISIT:  Medications - No data to display   ED Discharge Orders    None      *Please note:  Ryan Mcknight was evaluated in Emergency Department on 12/28/2019 for the symptoms described in the history of present illness. He was evaluated in the context of the global COVID-19 pandemic, which necessitated consideration that the patient might be at risk for infection with the SARS-CoV-2 virus that causes COVID-19. Institutional protocols and algorithms that pertain to the evaluation of patients at risk for COVID-19 are in a state of rapid change based on information released by regulatory bodies including the CDC and federal and state organizations. These policies and algorithms were followed  during the patient's care in the ED.  Some ED evaluations and interventions may be delayed as a result of limited staffing during and after the pandemic.*  Note:  This document was prepared using Dragon voice recognition software and may include unintentional dictation errors.   Darci Current, MD 12/28/19 [phone]

## 2019-12-29 LAB — HSV 2 ANTIBODY, IGG: HSV 2 Glycoprotein G Ab, IgG: <0.91 index (ref 0.00–0.90)

## 2019-12-29 LAB — RPR: RPR Ser Ql: NONREACTIVE

## 2020-01-26 ENCOUNTER — Ambulatory Visit: Payer: Self-pay

## 2020-07-25 ENCOUNTER — Encounter: Payer: Self-pay | Admitting: Emergency Medicine

## 2020-07-25 ENCOUNTER — Emergency Department
Admission: EM | Admit: 2020-07-25 | Discharge: 2020-07-25 | Disposition: A | Discharge status: Home / Self Care | Payer: Medicaid Other | Attending: Emergency Medicine | Admitting: Emergency Medicine

## 2020-07-25 ENCOUNTER — Other Ambulatory Visit: Payer: Self-pay

## 2020-07-25 DIAGNOSIS — Z202 Contact with and (suspected) exposure to infections with a predominantly sexual mode of transmission: Secondary | ICD-10-CM | POA: Insufficient documentation

## 2020-07-25 DIAGNOSIS — Z87891 Personal history of nicotine dependence: Secondary | ICD-10-CM | POA: Insufficient documentation

## 2020-07-25 LAB — CHLAMYDIA/NGC RT PCR (ARMC ONLY)
Chlamydia Tr: NOT DETECTED
N gonorrhoeae: NOT DETECTED

## 2020-07-25 NOTE — ED Provider Notes (Signed)
[facility] Emergency Department Provider Note ____________________________________________  Time seen: 1135  I have reviewed the triage vital signs and the nursing notes.  HISTORY  Chief Complaint  Exposure to STD   HPI Ryan Mcknight is a 30 y.o. male presents to the ER today with complaint of exposure to chlamydia.  He is asymptomatic at this time.  He denies urinary urgency, frequency, dysuria, penile discharge, penile lesion or ulceration, testicular pain or swelling.  Past Medical History:  Diagnosis Date  . Bipolar 1 disorder (HCC)   . Depression   . Drug abuse (HCC)   . Schizoaffective disorder (HCC)     There are no problems to display for this patient.   Past Surgical History:  Procedure Laterality Date  . DENTAL SURGERY      Prior to Admission medications   Medication Sig Start Date End Date Taking? Authorizing Provider  divalproex (DEPAKOTE) 250 MG DR tablet Take 250 mg by mouth 3 (three) times daily.    [provider]  famotidine (PEPCID) 40 MG tablet Take 1 tablet (40 mg total) by mouth at bedtime. 12/11/18 01/10/19  Nita Sickle, MD  mirtazapine (REMERON) 15 MG tablet Take 15 mg by mouth at bedtime.    [provider]  naproxen (NAPROSYN) 500 MG tablet Take 500 mg by mouth 2 (two) times daily with a meal.    [provider]  OLANZapine (ZYPREXA) 10 MG tablet Take 10 mg by mouth at bedtime.    [provider]    Allergies Acetaminophen  History reviewed. No pertinent family history.  Social History Social History   Tobacco Use  . Smoking status: Former Games developer  . Smokeless tobacco: Never Used  Vaping Use  . Vaping Use: Every day  . Substances: Nicotine  Substance Use Topics  . Alcohol use: Not Currently    Comment: "rarely"  . Drug use: Yes    Types: Marijuana    Comment: daily    Review of Systems  Constitutional: Negative for fever, chills or body aches. Cardiovascular:  Negative for chest pain or chest tightness. Respiratory: Negative for cough or shortness of breath. Gastrointestinal: Negative for nausea or abdominal pain. Genitourinary: Negative for urinary urgency, frequency, dysuria, blood in his urine, penile discharge, testicular pain or swelling. Musculoskeletal: Negative for low back pain. Skin: Negative for rash, lesion or ulceration.  ____________________________________________  PHYSICAL EXAM:  VITAL SIGNS: ED Triage Vitals  Enc Vitals Group     BP 07/25/20 1130 122/83     Pulse Rate 07/25/20 1130 84     Resp 07/25/20 1130 17     Temp 07/25/20 1130 98.5 F (36.9 C)     Temp Source 07/25/20 1130 Oral     SpO2 --      Weight 07/25/20 1129 180 lb (81.6 kg)     Height 07/25/20 1129 5\' 9"  (1.753 m)     Head Circumference --      Peak Flow --      Pain Score 07/25/20 1128 0     Pain Loc --      Pain Edu? --      Excl. in GC? --     Constitutional: Alert and oriented. Well appearing and in no distress. Cardiovascular: Normal rate, regular rhythm.  Respiratory: Normal respiratory effort. No wheezes/rales/rhonchi. Gastrointestinal: Soft and nontender.  Neurologic: Normal speech and language. No gross focal neurologic deficits are appreciated. Skin:  Skin is warm, dry and intact. No penile rash, lesion or ulceration  noted.  ____________________________________________   LABS Labs Reviewed  CHLAMYDIA/NGC RT PCR ([facility] ONLY)     INITIAL IMPRESSION / ASSESSMENT AND PLAN / ED COURSE  Exposure to Chlamydia:  Urine gonorrhea and chlamydia negative Given that he is asymptomatic, will hold off on treatment Discussed condom use    _______________________________________________  FINAL CLINICAL IMPRESSION(S) / ED DIAGNOSES  Final diagnoses:  Exposure to chlamydia      Lorre Munroe, NP 07/25/20 1328    Jene Every, MD 07/28/20 [phone]

## 2020-07-25 NOTE — ED Triage Notes (Signed)
Pt comes into the ED via POV c/o exposure to a sexual partner with chlamydia.  Pt in NAD at this time and denies any problems or symptoms currently.

## 2020-07-25 NOTE — Discharge Instructions (Addendum)
You were seen today for exposure to chlamydia.  Your urine gonorrhea and Chlamydia tests were negative.  Given the fact that you do not have symptoms with any negative test, no treatment is indicated at this time.  We encouraged condom use.

## 2020-07-25 NOTE — ED Notes (Signed)
See triage note  States he was told that he was exposed to an STD by his sexual partner

## 2021-06-06 ENCOUNTER — Encounter (HOSPITAL_COMMUNITY): Payer: Self-pay

## 2021-06-06 ENCOUNTER — Other Ambulatory Visit: Payer: Self-pay

## 2021-06-06 ENCOUNTER — Ambulatory Visit (HOSPITAL_COMMUNITY)
Admission: EM | Admit: 2021-06-06 | Discharge: 2021-06-06 | Disposition: A | Discharge status: Home / Self Care | Payer: Medicaid Other | Attending: Student | Admitting: Student

## 2021-06-06 DIAGNOSIS — J111 Influenza due to unidentified influenza virus with other respiratory manifestations: Secondary | ICD-10-CM

## 2021-06-06 LAB — POC INFLUENZA A AND B ANTIGEN (URGENT CARE ONLY)
INFLUENZA A ANTIGEN, POC: NEGATIVE
INFLUENZA B ANTIGEN, POC: NEGATIVE

## 2021-06-06 MED ORDER — OSELTAMIVIR PHOSPHATE 75 MG PO CAPS: 75.0000 mg | ORAL_CAPSULE | Freq: Two times a day (BID) | ORAL | 0 refills | Status: AC

## 2021-06-06 MED ORDER — ONDANSETRON 8 MG PO TBDP: 8.0000 mg | ORAL_TABLET | Freq: Three times a day (TID) | ORAL | 0 refills | Status: AC | PRN

## 2021-06-06 NOTE — Discharge Instructions (Addendum)
-  Your flu test was negative; I think this was probably a false negative, and you really do have the flu. -Tamiflu twice daily x5 days. This medication can cause nausea, so I also sent nausea medication. You can stop the Tamiflu if you don't like it or if it causes side effects.  -Take the Zofran (ondansetron) up to 3 times daily for nausea and vomiting. -For fevers/chills, bodyaches, headaches- You can take Tylenol up to 1000 mg 3 times daily, and ibuprofen up to 600 mg 3 times daily with food.  You can take these together, or alternate every 3-4 hours. -Drink plenty of water/gatorade and get plenty of rest -With a virus, you're typically contagious for 5-7 days, or as long as you're having fevers.  -Come back and see [place] if things are getting worse instead of better, like shortness of breath, chest pain, fevers and chills that are getting higher instead of lower and do not come down with Tylenol or ibuprofen, etc.

## 2021-06-06 NOTE — ED Triage Notes (Signed)
Pt presents with cough,body aches and fever x 2 days. Pt reports taking home covid test results were negative.

## 2021-06-06 NOTE — ED Provider Notes (Signed)
[facility]    CSN: [id number] Arrival date & time: 06/06/21  1038      History   Chief Complaint Chief Complaint  Patient presents with   Fever   Cough    HPI Ryan Mcknight is a 30 y.o. male presenting with viral syndrome for 2 days (today is day 2).  Medical history bipolar 1, schizoaffective, depression.  Denies history of pulmonary disease.  Describes nonproductive cough, generalized body aches, subjective fevers and chills, decreased sense of taste and smell.  Negative home COVID test on day 2 symptoms.  Has attempted medications - tylenol, theraflu- and also tried epsom salt bath for the bodyaches. Nausea without vomiting, diarrhea.  Has not monitored temperature at home.  Denies shortness of breath, chest pain, dizziness, weakness.  HPI  Past Medical History:  Diagnosis Date   Bipolar 1 disorder (HCC)    Depression    Drug abuse (HCC)    Schizoaffective disorder (HCC)     There are no problems to display for this patient.   Past Surgical History:  Procedure Laterality Date   DENTAL SURGERY         Home Medications    Prior to Admission medications   Medication Sig Start Date End Date Taking? Authorizing Provider  ondansetron (ZOFRAN-ODT) 8 MG disintegrating tablet Take 1 tablet (8 mg total) by mouth every 8 (eight) hours as needed for nausea or vomiting. 06/06/21  Yes Rhys Martini, PA-C  oseltamivir (TAMIFLU) 75 MG capsule Take 1 capsule (75 mg total) by mouth every 12 (twelve) hours. 06/06/21  Yes Rhys Martini, PA-C  divalproex (DEPAKOTE) 250 MG DR tablet Take 250 mg by mouth 3 (three) times daily.    [provider]  famotidine (PEPCID) 40 MG tablet Take 1 tablet (40 mg total) by mouth at bedtime. 12/11/18 01/10/19  Nita Sickle, MD  mirtazapine (REMERON) 15 MG tablet Take 15 mg by mouth at bedtime.    [provider]  naproxen (NAPROSYN) 500 MG tablet Take 500 mg by mouth 2 (two) times daily with a meal.     [provider]  OLANZapine (ZYPREXA) 10 MG tablet Take 10 mg by mouth at bedtime.    [provider]    Family History No family history on file.  Social History Social History   Tobacco Use   Smoking status: Former   Smokeless tobacco: Never  Building services engineer Use: Every day   Substances: Nicotine  Substance Use Topics   Alcohol use: Not Currently    Comment: "rarely"   Drug use: Yes    Types: Marijuana    Comment: daily     Allergies   Acetaminophen   Review of Systems Review of Systems  Constitutional:  Negative for appetite change, chills and fever.  HENT:  Positive for congestion. Negative for ear pain, rhinorrhea, sinus pressure, sinus pain and sore throat.   Eyes:  Negative for redness and visual disturbance.  Respiratory:  Positive for cough. Negative for chest tightness, shortness of breath and wheezing.   Cardiovascular:  Negative for chest pain and palpitations.  Gastrointestinal:  Positive for nausea. Negative for abdominal pain, constipation, diarrhea and vomiting.  Genitourinary:  Negative for dysuria, frequency and urgency.  Musculoskeletal:  Positive for myalgias.  Neurological:  Negative for dizziness, weakness and headaches.  Psychiatric/Behavioral:  Negative for confusion.   All other systems reviewed and are negative.   Physical Exam Triage Vital Signs ED Triage Vitals  Enc Vitals  Group     BP 06/06/21 1209 128/79     Pulse Rate 06/06/21 1209 82     Resp 06/06/21 1209 16     Temp 06/06/21 1209 98.8 F (37.1 C)     Temp Source 06/06/21 1209 Oral     SpO2 06/06/21 1209 100 %     Weight --      Height --      Head Circumference --      Peak Flow --      Pain Score 06/06/21 1210 8     Pain Loc --      Pain Edu? --      Excl. in GC? --    No data found.  Updated Vital Signs BP 128/79 (BP Location: Left Arm)    Pulse 82    Temp 98.8 F (37.1 C) (Oral)    Resp 16    SpO2 100%   Visual Acuity Right Eye Distance:    Left Eye Distance:   Bilateral Distance:    Right Eye Near:   Left Eye Near:    Bilateral Near:     Physical Exam Vitals reviewed.  Constitutional:      General: He is not in acute distress.    Appearance: Normal appearance. He is ill-appearing.  HENT:     Head: Normocephalic and atraumatic.     Right Ear: Tympanic membrane, ear canal and external ear normal. No tenderness. No middle ear effusion. There is no impacted cerumen. Tympanic membrane is not perforated, erythematous, retracted or bulging.     Left Ear: Tympanic membrane, ear canal and external ear normal. No tenderness.  No middle ear effusion. There is no impacted cerumen. Tympanic membrane is not perforated, erythematous, retracted or bulging.     Nose: Nose normal. No congestion.     Mouth/Throat:     Mouth: Mucous membranes are moist.     Pharynx: Uvula midline. No oropharyngeal exudate or posterior oropharyngeal erythema.  Eyes:     Extraocular Movements: Extraocular movements intact.     Pupils: Pupils are equal, round, and reactive to light.  Cardiovascular:     Rate and Rhythm: Normal rate and regular rhythm.     Heart sounds: Normal heart sounds.  Pulmonary:     Effort: Pulmonary effort is normal.     Breath sounds: Normal breath sounds. No decreased breath sounds, wheezing, rhonchi or rales.  Abdominal:     Palpations: Abdomen is soft.     Tenderness: There is no abdominal tenderness. There is no guarding or rebound.  Lymphadenopathy:     Cervical: No cervical adenopathy.     Right cervical: No superficial cervical adenopathy.    Left cervical: No superficial cervical adenopathy.  Neurological:     General: No focal deficit present.     Mental Status: He is alert and oriented to person, place, and time.  Psychiatric:        Mood and Affect: Mood normal.        Behavior: Behavior normal.        Thought Content: Thought content normal.        Judgment: Judgment normal.     UC Treatments / Results   Labs (all labs ordered are listed, but only abnormal results are displayed) Labs Reviewed  POC INFLUENZA A AND B ANTIGEN (URGENT CARE ONLY)    EKG   Radiology No results found.  Procedures Procedures (including critical care time)  Medications Ordered in UC Medications - No  data to display  Initial Impression / Assessment and Plan / UC Course  I have reviewed the triage vital signs and the nursing notes.  Pertinent labs & imaging results that were available during my care of the patient were reviewed by me and considered in my medical decision making (see chart for details).     This patient is a very pleasant 30 y.o. year old male presenting with suspected influenza. Today this pt is afebrile nontachycardic nontachypneic, oxygenating well on room air, no wheezes rhonchi or rales.   Negative home COVID test, declines additional testing today. Negative rapid influenza test today. Suspect this was a false negative.  Tamiflu and zofran odt sent.   Work note provided. ED return precautions discussed. Patient verbalizes understanding and agreement.   Final Clinical Impressions(s) / UC Diagnoses   Final diagnoses:  Influenza with respiratory manifestation     Discharge Instructions      -Your flu test was negative; I think this was probably a false negative, and you really do have the flu. -Tamiflu twice daily x5 days. This medication can cause nausea, so I also sent nausea medication. You can stop the Tamiflu if you don't like it or if it causes side effects.  -Take the Zofran (ondansetron) up to 3 times daily for nausea and vomiting. -For fevers/chills, bodyaches, headaches- You can take Tylenol up to 1000 mg 3 times daily, and ibuprofen up to 600 mg 3 times daily with food.  You can take these together, or alternate every 3-4 hours. -Drink plenty of water/gatorade and get plenty of rest -With a virus, you're typically contagious for 5-7 days, or as long as you're having  fevers.  -Come back and see [place] if things are getting worse instead of better, like shortness of breath, chest pain, fevers and chills that are getting higher instead of lower and do not come down with Tylenol or ibuprofen, etc.      ED Prescriptions     Medication Sig Dispense Auth. Provider   oseltamivir (TAMIFLU) 75 MG capsule Take 1 capsule (75 mg total) by mouth every 12 (twelve) hours. 10 capsule Ignacia Bayley E, PA-C   ondansetron (ZOFRAN-ODT) 8 MG disintegrating tablet Take 1 tablet (8 mg total) by mouth every 8 (eight) hours as needed for nausea or vomiting. 20 tablet Rhys Martini, PA-C      PDMP not reviewed this encounter.   Rhys Martini, PA-C 06/06/21 1421

## 2022-01-14 ENCOUNTER — Encounter (HOSPITAL_COMMUNITY): Payer: Self-pay

## 2022-01-14 ENCOUNTER — Ambulatory Visit (HOSPITAL_COMMUNITY)
Admission: EM | Admit: 2022-01-14 | Discharge: 2022-01-14 | Disposition: A | Discharge status: Home / Self Care | Payer: Medicaid Other | Attending: Emergency Medicine | Admitting: Emergency Medicine

## 2022-01-14 DIAGNOSIS — K648 Other hemorrhoids: Secondary | ICD-10-CM

## 2022-01-14 MED ORDER — HYDROCORTISONE ACETATE 25 MG RE SUPP: 25.0000 mg | Freq: Two times a day (BID) | RECTAL | 0 refills | Status: AC

## 2022-01-14 NOTE — ED Provider Notes (Signed)
[facility]    CSN: [id number] Arrival date & time: 01/14/22  0941      History   Chief Complaint Chief Complaint  Patient presents with   Hemorrhoids    HPI Ryan Mcknight is a 31 y.o. male.   Patient presents with rectal pain for 1 month due to internal hemorrhoids.  Endorses that he has begun to see blood spots on tissue within the last week.  Has attempted use of an over-the-counter cream which has been ineffective.  History of internal hemorrhoids which have resolved in the past with over-the-counter medications.  Endorses a history of intermittent constipation, using Dulcolax when present.     Past Medical History:  Diagnosis Date   Bipolar 1 disorder (HCC)    Depression    Drug abuse (HCC)    Schizoaffective disorder (HCC)     There are no problems to display for this patient.   Past Surgical History:  Procedure Laterality Date   DENTAL SURGERY         Home Medications    Prior to Admission medications   Medication Sig Start Date End Date Taking? Authorizing Provider  divalproex (DEPAKOTE) 250 MG DR tablet Take 250 mg by mouth 3 (three) times daily.    [provider]  famotidine (PEPCID) 40 MG tablet Take 1 tablet (40 mg total) by mouth at bedtime. 12/11/18 01/10/19  Nita Sickle, MD  mirtazapine (REMERON) 15 MG tablet Take 15 mg by mouth at bedtime.    [provider]  naproxen (NAPROSYN) 500 MG tablet Take 500 mg by mouth 2 (two) times daily with a meal.    [provider]  OLANZapine (ZYPREXA) 10 MG tablet Take 10 mg by mouth at bedtime.    [provider]  ondansetron (ZOFRAN-ODT) 8 MG disintegrating tablet Take 1 tablet (8 mg total) by mouth every 8 (eight) hours as needed for nausea or vomiting. 06/06/21   Rhys Martini, PA-C  oseltamivir (TAMIFLU) 75 MG capsule Take 1 capsule (75 mg total) by mouth every 12 (twelve) hours. 06/06/21   Rhys Martini, PA-C    Family History History reviewed. No  pertinent family history.  Social History Social History   Tobacco Use   Smoking status: Every Day    Types: Cigars   Smokeless tobacco: Never  Vaping Use   Vaping Use: Every day   Substances: Nicotine  Substance Use Topics   Alcohol use: Not Currently    Comment: "rarely"   Drug use: Yes    Types: Marijuana    Comment: daily     Allergies   Acetaminophen   Review of Systems Review of Systems  Constitutional: Negative.   Respiratory: Negative.    Cardiovascular: Negative.   Gastrointestinal:  Positive for rectal pain. Negative for abdominal distention, abdominal pain, anal bleeding, blood in stool, constipation, diarrhea, nausea and vomiting.  Skin: Negative.   Neurological: Negative.      Physical Exam Triage Vital Signs ED Triage Vitals  Enc Vitals Group     BP 01/14/22 1022 124/79     Pulse Rate 01/14/22 1022 (!) 51     Resp 01/14/22 1022 16     Temp 01/14/22 1022 98.3 F (36.8 C)     Temp Source 01/14/22 1022 Oral     SpO2 01/14/22 1022 96 %     Weight 01/14/22 1025 185 lb (83.9 kg)     Height 01/14/22 1025 5\' 9"  (1.753 m)     Head Circumference --  Peak Flow --      Pain Score 01/14/22 1024 10     Pain Loc --      Pain Edu? --      Excl. in GC? --    No data found.  Updated Vital Signs BP 124/79 (BP Location: Right Arm)   Pulse (!) 51   Temp 98.3 F (36.8 C) (Oral)   Resp 16   Ht 5\' 9"  (1.753 m)   Wt 185 lb (83.9 kg)   SpO2 96%   BMI 27.32 kg/m   Visual Acuity Right Eye Distance:   Left Eye Distance:   Bilateral Distance:    Right Eye Near:   Left Eye Near:    Bilateral Near:     Physical Exam Constitutional:      Appearance: Normal appearance.  Eyes:     Extraocular Movements: Extraocular movements intact.  Pulmonary:     Effort: Pulmonary effort is normal.  Genitourinary:    Comments: No external hemorrhoids or anal fissure noted on exam, rectal tone is normal Neurological:     Mental Status: He is alert and oriented  to person, place, and time. Mental status is at baseline.  Psychiatric:        Mood and Affect: Mood normal.        Behavior: Behavior normal.      UC Treatments / Results  Labs (all labs ordered are listed, but only abnormal results are displayed) Labs Reviewed - No data to display  EKG   Radiology No results found.  Procedures Procedures (including critical care time)  Medications Ordered in UC Medications - No data to display  Initial Impression / Assessment and Plan / UC Course  I have reviewed the triage vital signs and the nursing notes.  Pertinent labs & imaging results that were available during my care of the patient were reviewed by me and considered in my medical decision making (see chart for details).  Internal hemorrhoids  Prescribed t hydrocortisone suppositories for treatment and recommended daily use of stool softener to prevent further irritation, may use external cream as needed for comfort, given walker referral to general surgery for further evaluation and management Final Clinical Impressions(s) / UC Diagnoses   Final diagnoses:  None   Discharge Instructions   None    ED Prescriptions   None    PDMP not reviewed this encounter.   , NP 01/14/22 1101

## 2022-01-14 NOTE — ED Triage Notes (Signed)
Hemorrhoids Patient had this about years ago before. Newest flare up onset 1 month ago. Patient tried over the counter cream but this has not helped. States notices some blood when wiping. States the pain is gradually getting worse.   States the first time he had hemorrhoids the OTC cream got rid of them.

## 2022-01-14 NOTE — Discharge Instructions (Signed)
Today you are being treated for hemorrhoids  Use hydrocortisone suppository every morning and every evening for the next 6 days, this will help to reduce swelling of your hemorrhoids which ideally will help reduce your pain, insert suppository into the rectal area and then lie on your left side for 10 minutes to allow suppository to begin to dissolve  You have been given information to general surgery as removal is the true resolution of symptoms, you may follow-up as needed  Eating and drinking  Eat foods that have a lot of fiber in them. These include whole grains, beans, nuts, fruits, and vegetables. Ask your doctor about taking products that have added fiber (fibersupplements). Reduce the amount of fat in your diet. You can do this by: Eating low-fat dairy products. Eating less red meat. Avoiding processed foods. Drink enough fluid to keep your pee (urine) pale yellow. Managing pain and swelling  Take a warm-water bath (sitz bath) for 20 minutes to ease pain. Do this 3-4 times a day. You may do this in a bathtub or using a portable sitz bath that fits over the toilet. If told, put ice on the painful area. It may be helpful to use ice between your warm baths. Put ice in a plastic bag. Place a towel between your skin and the bag. Leave the ice on for 20 minutes, 2-3 times a day. General instructions Take over-the-counter and prescription medicines only as told by your doctor. Medicated creams and medicines may be used as told. Exercise often. Ask your doctor how much and what kind of exercise is best for you. Go to the bathroom when you have the urge to poop. Do not wait. Avoid pushing too hard when you poop. Keep your butt dry and clean. Use wet toilet paper or moist towelettes after pooping. Do not sit on the toilet for a long time. Keep all follow-up visits as told by your doctor. This is important.
